# Patient Record
Sex: Female | Born: 2000
Health system: Southern US, Community
[De-identification: ages and names within clinical notes are randomized; demographics above are authoritative.]

## PROBLEM LIST (undated history)

## (undated) ENCOUNTER — Inpatient Hospital Stay (HOSPITAL_COMMUNITY): Payer: Self-pay

## (undated) DIAGNOSIS — F909 Attention-deficit hyperactivity disorder, unspecified type: Secondary | ICD-10-CM

## (undated) DIAGNOSIS — F319 Bipolar disorder, unspecified: Secondary | ICD-10-CM

## (undated) DIAGNOSIS — A549 Gonococcal infection, unspecified: Secondary | ICD-10-CM

## (undated) DIAGNOSIS — A749 Chlamydial infection, unspecified: Secondary | ICD-10-CM

## (undated) DIAGNOSIS — G4700 Insomnia, unspecified: Secondary | ICD-10-CM

## (undated) HISTORY — PX: CHOLECYSTECTOMY: SHX55

---
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
## 2019-01-09 ENCOUNTER — Other Ambulatory Visit: Payer: Self-pay

## 2019-01-09 DIAGNOSIS — Z87891 Personal history of nicotine dependence: Secondary | ICD-10-CM | POA: Insufficient documentation

## 2019-01-09 DIAGNOSIS — R1084 Generalized abdominal pain: Secondary | ICD-10-CM | POA: Insufficient documentation

## 2019-01-09 DIAGNOSIS — Z30430 Encounter for insertion of intrauterine contraceptive device: Secondary | ICD-10-CM | POA: Diagnosis not present

## 2019-01-10 ENCOUNTER — Other Ambulatory Visit: Payer: Self-pay

## 2019-01-10 ENCOUNTER — Emergency Department (HOSPITAL_COMMUNITY)
Admission: EM | Admit: 2019-01-10 | Discharge: 2019-01-10 | Disposition: A | Discharge status: Home / Self Care | Payer: BC Managed Care – PPO | Attending: Emergency Medicine | Admitting: Emergency Medicine

## 2019-01-10 ENCOUNTER — Emergency Department (HOSPITAL_COMMUNITY): Payer: BC Managed Care – PPO

## 2019-01-10 ENCOUNTER — Encounter (HOSPITAL_COMMUNITY): Payer: Self-pay | Admitting: *Deleted

## 2019-01-10 DIAGNOSIS — R1084 Generalized abdominal pain: Secondary | ICD-10-CM | POA: Diagnosis not present

## 2019-01-10 DIAGNOSIS — Z30430 Encounter for insertion of intrauterine contraceptive device: Secondary | ICD-10-CM | POA: Diagnosis not present

## 2019-01-10 LAB — URINALYSIS, ROUTINE W REFLEX MICROSCOPIC
Bilirubin Urine: NEGATIVE
Glucose, UA: NEGATIVE mg/dL
Hgb urine dipstick: NEGATIVE
Ketones, ur: NEGATIVE mg/dL
Nitrite: NEGATIVE
Protein, ur: NEGATIVE mg/dL
Specific Gravity, Urine: 1.021 (ref 1.005–1.030)
pH: 6 (ref 5.0–8.0)

## 2019-01-10 LAB — PREGNANCY, URINE: Preg Test, Ur: NEGATIVE

## 2019-01-10 MED ORDER — HYDROCODONE-ACETAMINOPHEN 5-325 MG PO TABS
1.0000 | ORAL_TABLET | Freq: Once | ORAL | Status: AC
  Administered 2019-01-10: 1 via ORAL
  Filled 2019-01-10: qty 1

## 2019-01-10 MED ORDER — CEPHALEXIN 500 MG PO CAPS: 500.0000 mg | ORAL_CAPSULE | Freq: Two times a day (BID) | ORAL | 0 refills | Status: DC

## 2019-01-10 MED ORDER — HYDROCODONE-ACETAMINOPHEN 5-325 MG PO TABS: 1.0000 | ORAL_TABLET | Freq: Four times a day (QID) | ORAL | 0 refills | Status: DC | PRN

## 2019-01-10 MED ORDER — CEPHALEXIN 500 MG PO CAPS
500.0000 mg | ORAL_CAPSULE | Freq: Once | ORAL | Status: AC
  Administered 2019-01-10: 02:00:00 500 mg via ORAL
  Filled 2019-01-10: qty 1

## 2019-01-10 NOTE — ED Provider Notes (Signed)
[facility] EMERGENCY DEPARTMENT Provider Note   CSN: [id number] Arrival date & time: 01/09/19  2333     History   Chief Complaint Chief Complaint  Patient presents with  . Abdominal Pain    HPI Diana Pennington is a 18 y.o. female.     The history is provided by the patient.  Abdominal Pain Pain location:  Generalized Pain quality: aching   Pain radiates to:  Back Pain severity:  Moderate Onset quality:  Gradual Timing:  Intermittent Progression:  Worsening Chronicity:  New Relieved by:  Nothing Worsened by:  Nothing Associated symptoms: no dysuria, no fever, no vaginal bleeding and no vaginal discharge   Patient with history of ADHD presents with abdominal pain.  She reports over the past several days she has had increasing abdominal pain that she feels is related to her IUD.  No fevers or vomiting.  No diarrhea.  No dysuria, no vaginal discharge.  She reports at times the pain will radiate to her back  Past Medical History:  Diagnosis Date  . ADHD   . ADHD (attention deficit hyperactivity disorder)   . Attention deficit hyperactivity disorder (ADHD) 07/21/2015  . Bipolar 1 disorder ([place])   . Bipolar 1 disorder ([place])   . Chlamydia   . Gonorrhea   . Insomnia 07/21/2015    Patient Active Problem List   Diagnosis Date Noted  . PID (acute pelvic inflammatory disease) 04/11/2017  . Indication for care in labor or delivery 04/23/2016  . Bipolar affect, depressed ([place]) 07/21/2015  . Attention deficit hyperactivity disorder (ADHD) 07/21/2015  . Insomnia 07/21/2015    Past Surgical History:  Procedure Laterality Date  . CHOLECYSTECTOMY       OB History    Gravida  1   Para  1   Term  1   Preterm      AB      Living  1     SAB      TAB      Ectopic      Multiple  0   Live Births  1            Home Medications    Prior to Admission medications   Medication Sig Start Date End Date Taking? Authorizing Provider  ARIPiprazole (ABILIFY) 10  MG tablet Take 10 mg by mouth 2 (two) times daily.  10/12/16   [provider]  atomoxetine (STRATTERA) 40 MG capsule  07/11/17   [provider]  benztropine (COGENTIN) 0.5 MG tablet Take 1 tablet (0.5 mg total) by mouth 2 (two) times daily. 07/29/15   Philipp Ovens, MD  escitalopram (LEXAPRO) 10 MG tablet Take 10 mg by mouth at bedtime.  10/27/16   [provider]  hydrOXYzine (ATARAX/VISTARIL) 10 MG tablet Take 10 mg by mouth 2 (two) times daily as needed for itching or anxiety.    [provider]  levonorgestrel (MIRENA) 20 MCG/24HR IUD 1 each by Intrauterine route once.    [provider]    Family History Family History  Problem Relation Age of Onset  . Diabetes Mother   . Hypertension Mother     Social History Social History   Tobacco Use  . Smoking status: Former Smoker    Packs/day: 0.50    Years: 1.00    Pack years: 0.50    Types: Cigarettes  . Smokeless tobacco: Never Used  Substance Use Topics  . Alcohol use: No  . Drug use: Yes  Types: Marijuana     Allergies   Ativan [lorazepam]   Review of Systems Review of Systems  Constitutional: Negative for fever.  Gastrointestinal: Positive for abdominal pain.  Genitourinary: Negative for dysuria, vaginal bleeding and vaginal discharge.  All other systems reviewed and are negative.    Physical Exam Updated Vital Signs BP 119/87 (BP Location: Right Arm)   Pulse 81   Temp 98.2 F (36.8 C) (Oral)   Resp 18   Ht 1.549 m (5\' 1" )   Wt 72.5 kg   SpO2 100%   BMI 30.20 kg/m   Physical Exam CONSTITUTIONAL: Well developed/well nourished, no acute distress HEAD: Normocephalic/atraumatic EYES: EOMI ENMT: Mask in place NECK: supple no meningeal signs SPINE/BACK:entire spine nontender CV: S1/S2 noted, no murmurs/rubs/gallops noted LUNGS: Lungs are clear to auscultation bilaterally, no apparent distress ABDOMEN: soft, mild diffuse tenderness, no rebound or  guarding, bowel sounds noted throughout abdomen GU:no cva tenderness NEURO: Pt is awake/alert/appropriate, moves all extremitiesx4.  No facial droop.   EXTREMITIES: pulses normal/equal, full ROM SKIN: warm, color normal PSYCH: no abnormalities of mood noted, alert and oriented to situation   ED Treatments / Results  Labs (all labs ordered are listed, but only abnormal results are displayed) Labs Reviewed  URINALYSIS, ROUTINE W REFLEX MICROSCOPIC - Abnormal; Notable for the following components:      Result Value   APPearance HAZY (*)    Leukocytes,Ua SMALL (*)    Bacteria, UA RARE (*)    All other components within normal limits  PREGNANCY, URINE    EKG None  Radiology Dg Abdomen 1 View  Result Date: 01/10/2019 CLINICAL DATA:  IUD placement EXAM: ABDOMEN - 1 VIEW COMPARISON:  CT 08/08/2017 FINDINGS: Nonobstructed bowel-gas pattern. IUD within the pelvis. Lung bases are clear IMPRESSION: 1. Nonobstructed gas pattern 2. IUD within the pelvis. If more detailed evaluation of the IUD position is required, pelvic ultrasound or CT could be obtained. Electronically Signed   By: 08/10/2017 M.D.   On: 01/10/2019 02:35    Procedures Procedures Medications Ordered in ED Medications  cephALEXin (KEFLEX) capsule 500 mg (500 mg Oral Given 01/10/19 0221)  HYDROcodone-acetaminophen (NORCO/VICODIN) 5-325 MG per tablet 1 tablet (1 tablet Oral Given 01/10/19 0221)     Initial Impression / Assessment and Plan / ED Course  I have reviewed the triage vital signs and the nursing notes.  Pertinent labs & imaging results that were available during my care of the patient were reviewed by me and considered in my medical decision making (see chart for details).        2:05 AM Patient is very concerned about her IUD.  Will obtain a KUB to ensure placement.  She may also be developing a urinary tract infection, will give antibiotics and pain medicine 3:11 AM Will treat for UTI Patient is  well-appearing no acute distress.  She is watching television and in no distress.  She does have mild diffuse tenderness, but no signs of acute abdomen Patient is very concerned about her IUD placement.  However she denies any vaginal bleeding or discharge, reports she has not been sexually active in 6 months She is not interested in STD testing I did offer her next day pelvic ultrasound to evaluate pain and IUD placement.  Patient is agreeable with plan.  Also advised follow-up with her OB/GYN.  We discussed strict ER return precautions.  She also request short course of pain medicine.  Final Clinical Impressions(s) / ED Diagnoses   Final  diagnoses:  Generalized abdominal pain    ED Discharge Orders         Ordered    cephALEXin (KEFLEX) 500 MG capsule  2 times daily     01/10/19 0254    US Transvaginal Non-OB (US Pelvis)     01/10/19 0254    HYDROcodone-acetaminophen (NORCO/VICODIN) 5-325 MG tablet  Every 6 hours PRN     01/10/19 0310           Zadie RhineWickline, Abdinasir Spadafore, MD 01/10/19 [phone]

## 2019-01-10 NOTE — Discharge Instructions (Addendum)

## 2019-01-10 NOTE — ED Triage Notes (Signed)
Pt c/o pelvic pain; pt states she feels like her IUD is not in place; pt c/o bilateral lower back pain that started today

## 2019-03-09 ENCOUNTER — Emergency Department (HOSPITAL_COMMUNITY)
Admission: EM | Admit: 2019-03-09 | Discharge: 2019-03-10 | Disposition: A | Discharge status: Home / Self Care | Payer: BC Managed Care – PPO | Attending: Emergency Medicine | Admitting: Emergency Medicine

## 2019-03-09 ENCOUNTER — Encounter (HOSPITAL_COMMUNITY): Payer: Self-pay | Admitting: Emergency Medicine

## 2019-03-09 ENCOUNTER — Other Ambulatory Visit: Payer: Self-pay

## 2019-03-09 DIAGNOSIS — H5712 Ocular pain, left eye: Secondary | ICD-10-CM | POA: Diagnosis not present

## 2019-03-09 DIAGNOSIS — Z5321 Procedure and treatment not carried out due to patient leaving prior to being seen by health care provider: Secondary | ICD-10-CM | POA: Diagnosis not present

## 2019-03-09 NOTE — ED Triage Notes (Signed)
Patient states that she has been having sharp pains in her left eye x 1 week. Patient also states that she has head congestion x 1 week.

## 2019-03-10 NOTE — ED Notes (Signed)
No answer in waiting room X

## 2019-03-10 NOTE — ED Notes (Signed)
No answer in waiting room X1,  

## 2019-03-18 ENCOUNTER — Other Ambulatory Visit: Payer: Self-pay

## 2019-03-18 ENCOUNTER — Emergency Department (HOSPITAL_COMMUNITY)
Admission: EM | Admit: 2019-03-18 | Discharge: 2019-03-18 | Disposition: A | Discharge status: Home / Self Care | Payer: BC Managed Care – PPO | Attending: Emergency Medicine | Admitting: Emergency Medicine

## 2019-03-18 DIAGNOSIS — Z87891 Personal history of nicotine dependence: Secondary | ICD-10-CM | POA: Insufficient documentation

## 2019-03-18 DIAGNOSIS — R8281 Pyuria: Secondary | ICD-10-CM | POA: Diagnosis not present

## 2019-03-18 DIAGNOSIS — Z79899 Other long term (current) drug therapy: Secondary | ICD-10-CM | POA: Insufficient documentation

## 2019-03-18 DIAGNOSIS — R1030 Lower abdominal pain, unspecified: Secondary | ICD-10-CM | POA: Diagnosis not present

## 2019-03-18 LAB — URINALYSIS, ROUTINE W REFLEX MICROSCOPIC
Bacteria, UA: NONE SEEN
Bilirubin Urine: NEGATIVE
Glucose, UA: NEGATIVE mg/dL
Hgb urine dipstick: NEGATIVE
Ketones, ur: NEGATIVE mg/dL
Nitrite: NEGATIVE
Protein, ur: NEGATIVE mg/dL
Specific Gravity, Urine: 1.011 (ref 1.005–1.030)
pH: 6 (ref 5.0–8.0)

## 2019-03-18 LAB — PREGNANCY, URINE: Preg Test, Ur: NEGATIVE

## 2019-03-18 MED ORDER — NITROFURANTOIN MONOHYD MACRO 100 MG PO CAPS: 100.0000 mg | ORAL_CAPSULE | Freq: Two times a day (BID) | ORAL | 0 refills | Status: DC

## 2019-03-18 NOTE — Discharge Instructions (Signed)
Please take Macrobid twice a day for 5 days, seek medical exam for severe or worsening symptoms.  If your testing comes back positive for an STD will be contacted

## 2019-03-18 NOTE — ED Provider Notes (Signed)
[facility] EMERGENCY DEPARTMENT Provider Note   CSN: [id number] Arrival date & time: 03/18/19  8833     History Chief Complaint  Patient presents with  . Abdominal Pain    Diana Pennington is a 18 y.o. female.  HPI   This patient is a 18 year old female, she presents to the hospital with a complaint of some lower to mid abdominal discomfort with urinary frequency which has been going on for a couple of days.  She denies any vaginal discharge and has had no new sexual partners.  She has an IUD in place and doubts that she is pregnant.  She has no back pain, no fevers or chills, no nausea or vomiting.  She denies any dysuria.  Her last urinary tract infection was last year.  She has had no medications prior to arrival, the symptoms are intermittent.  She denies any prior abdominal surgery.  Her last CT scan was in May 2019, after trauma, it was unremarkable  Past Medical History:  Diagnosis Date  . ADHD   . ADHD (attention deficit hyperactivity disorder)   . Attention deficit hyperactivity disorder (ADHD) 07/21/2015  . Bipolar 1 disorder (HCC)   . Bipolar 1 disorder (HCC)   . Chlamydia   . Gonorrhea   . Insomnia 07/21/2015    Patient Active Problem List   Diagnosis Date Noted  . PID (acute pelvic inflammatory disease) 04/11/2017  . Indication for care in labor or delivery 04/23/2016  . Bipolar affect, depressed (HCC) 07/21/2015  . Attention deficit hyperactivity disorder (ADHD) 07/21/2015  . Insomnia 07/21/2015    Past Surgical History:  Procedure Laterality Date  . CHOLECYSTECTOMY       OB History    Gravida  1   Para  1   Term  1   Preterm      AB      Living  1     SAB      TAB      Ectopic      Multiple  0   Live Births  1           Family History  Problem Relation Age of Onset  . Diabetes Mother   . Hypertension Mother     Social History   Tobacco Use  . Smoking status: Former Smoker    Packs/day: 0.50    Years: 1.00    Pack years:  0.50    Types: Cigarettes  . Smokeless tobacco: Never Used  Substance Use Topics  . Alcohol use: No  . Drug use: Yes    Types: Marijuana    Home Medications Prior to Admission medications   Medication Sig Start Date End Date Taking? Authorizing Provider  ARIPiprazole (ABILIFY) 10 MG tablet Take 10 mg by mouth 2 (two) times daily.  10/12/16   [provider]  atomoxetine (STRATTERA) 40 MG capsule  07/11/17   [provider]  benztropine (COGENTIN) 0.5 MG tablet Take 1 tablet (0.5 mg total) by mouth 2 (two) times daily. 07/29/15   [name], MD  escitalopram (LEXAPRO) 10 MG tablet Take 10 mg by mouth at bedtime.  10/27/16   [provider]  hydrOXYzine (ATARAX/VISTARIL) 10 MG tablet Take 10 mg by mouth 2 (two) times daily as needed for itching or anxiety.    [provider]  levonorgestrel (MIRENA) 20 MCG/24HR IUD 1 each by Intrauterine route once.    [provider]  nitrofurantoin, macrocrystal-monohydrate, (MACROBID) 100 MG capsule Take 1 capsule (  100 mg total) by mouth 2 (two) times daily. 03/18/19   Eber HongMiller, Kylin Genna, MD    Allergies    Ativan [lorazepam]  Review of Systems   Review of Systems  Constitutional: Negative for chills and fever.  Gastrointestinal: Positive for abdominal pain. Negative for nausea and vomiting.  Genitourinary: Positive for difficulty urinating and frequency. Negative for dysuria, flank pain, menstrual problem, vaginal bleeding, vaginal discharge and vaginal pain.  Musculoskeletal: Negative for back pain.    Physical Exam Updated Vital Signs BP 108/66 (BP Location: Right Arm)   Pulse 87   Temp 97.6 F (36.4 C) (Oral)   Resp 18   Ht 1.549 m (5\' 1" )   Wt 68 kg   LMP 02/23/2019 (Exact Date)   SpO2 100%   BMI 28.33 kg/m   Physical Exam Vitals and nursing note reviewed.  Constitutional:      General: She is not in acute distress.    Appearance: She is well-developed.  HENT:     Head:  Normocephalic and atraumatic.     Mouth/Throat:     Pharynx: No oropharyngeal exudate.  Eyes:     General: No scleral icterus.       Right eye: No discharge.        Left eye: No discharge.     Conjunctiva/sclera: Conjunctivae normal.     Pupils: Pupils are equal, round, and reactive to light.  Neck:     Thyroid: No thyromegaly.     Vascular: No JVD.  Cardiovascular:     Rate and Rhythm: Normal rate and regular rhythm.     Heart sounds: Normal heart sounds. No murmur. No friction rub. No gallop.   Pulmonary:     Effort: Pulmonary effort is normal. No respiratory distress.     Breath sounds: Normal breath sounds. No wheezing or rales.  Abdominal:     General: Bowel sounds are normal. There is no distension.     Palpations: Abdomen is soft. There is no mass.     Tenderness: There is abdominal tenderness.     Comments: Mild lower abdominal tenderness but very soft and no guarding masses or peritoneal signs  Musculoskeletal:        General: No tenderness. Normal range of motion.     Cervical back: Normal range of motion and neck supple.  Lymphadenopathy:     Cervical: No cervical adenopathy.  Skin:    General: Skin is warm and dry.     Findings: No erythema or rash.  Neurological:     Mental Status: She is alert.     Coordination: Coordination normal.  Psychiatric:        Behavior: Behavior normal.     ED Results / Procedures / Treatments   Labs (all labs ordered are listed, but only abnormal results are displayed) Labs Reviewed  URINALYSIS, ROUTINE W REFLEX MICROSCOPIC - Abnormal; Notable for the following components:      Result Value   Color, Urine STRAW (*)    Leukocytes,Ua MODERATE (*)    All other components within normal limits  URINE CULTURE  PREGNANCY, URINE  GC/CHLAMYDIA PROBE AMP ([place] NOT [facility]    EKG None  Radiology No results found.  Procedures Procedures (including critical care time)  Medications Ordered in ED Medications - No data  to display  ED Course  I have reviewed the triage vital signs and the nursing notes.  Pertinent labs & imaging results that were available during my care of the patient  were reviewed by me and considered in my medical decision making (see chart for details).  Clinical Course as of Mar 17 426  Mon Mar 18, 2019  3570 Urinalysis reveals moderate leukocytes with 21-50 white blood cells but no bacteria.   [BM]  [phone] We will add on urine for gonorrhea and chlamydia and culture is pending   [BM]    Clinical Course User Index [BM] Noemi Chapel, MD   MDM Rules/Calculators/A&P                      The patient is well-appearing, her vital signs are unremarkable, her abdominal exam is benign, there is no signs of acute appendicitis.  She has had some urinary frequency for which she will have a urinary evaluation with a culture as well and a pregnancy test.  Patient is agreeable, denies discharge, denies fevers, denies nausea or vomiting.  There is no pain McBurney's point and no tenderness for upper abdominal tenderness to suggest cholecystitis or pancreatitis.  Final Clinical Impression(s) / ED Diagnoses Final diagnoses:  Pyuria    Rx / DC Orders ED Discharge Orders         Ordered    nitrofurantoin, macrocrystal-monohydrate, (MACROBID) 100 MG capsule  2 times daily     03/18/19 0426           Noemi Chapel, MD 03/18/19 [phone]

## 2019-03-18 NOTE — ED Triage Notes (Signed)
Pt here because she thinks she has an UTI. Per pt, having lower abdominal pain and urinary frequency.

## 2019-03-19 LAB — URINE CULTURE: Culture: NO GROWTH

## 2019-04-05 DIAGNOSIS — Z01419 Encounter for gynecological examination (general) (routine) without abnormal findings: Secondary | ICD-10-CM | POA: Diagnosis not present

## 2019-04-05 DIAGNOSIS — R87610 Atypical squamous cells of undetermined significance on cytologic smear of cervix (ASC-US): Secondary | ICD-10-CM | POA: Diagnosis not present

## 2019-04-05 DIAGNOSIS — Z113 Encounter for screening for infections with a predominantly sexual mode of transmission: Secondary | ICD-10-CM | POA: Diagnosis not present

## 2019-04-05 DIAGNOSIS — B009 Herpesviral infection, unspecified: Secondary | ICD-10-CM | POA: Diagnosis not present

## 2019-04-05 DIAGNOSIS — Z6829 Body mass index (BMI) 29.0-29.9, adult: Secondary | ICD-10-CM | POA: Diagnosis not present

## 2019-04-20 ENCOUNTER — Other Ambulatory Visit: Payer: Self-pay

## 2019-04-20 ENCOUNTER — Emergency Department (HOSPITAL_COMMUNITY)
Admission: EM | Admit: 2019-04-20 | Discharge: 2019-04-20 | Disposition: A | Discharge status: Home / Self Care | Payer: BC Managed Care – PPO | Attending: Emergency Medicine | Admitting: Emergency Medicine

## 2019-04-20 ENCOUNTER — Emergency Department (HOSPITAL_COMMUNITY): Payer: BC Managed Care – PPO

## 2019-04-20 ENCOUNTER — Encounter (HOSPITAL_COMMUNITY): Payer: Self-pay

## 2019-04-20 DIAGNOSIS — F909 Attention-deficit hyperactivity disorder, unspecified type: Secondary | ICD-10-CM | POA: Diagnosis not present

## 2019-04-20 DIAGNOSIS — F1210 Cannabis abuse, uncomplicated: Secondary | ICD-10-CM | POA: Diagnosis not present

## 2019-04-20 DIAGNOSIS — R079 Chest pain, unspecified: Secondary | ICD-10-CM | POA: Diagnosis not present

## 2019-04-20 DIAGNOSIS — Z79899 Other long term (current) drug therapy: Secondary | ICD-10-CM | POA: Insufficient documentation

## 2019-04-20 DIAGNOSIS — R0602 Shortness of breath: Secondary | ICD-10-CM | POA: Diagnosis not present

## 2019-04-20 DIAGNOSIS — Z87891 Personal history of nicotine dependence: Secondary | ICD-10-CM | POA: Diagnosis not present

## 2019-04-20 DIAGNOSIS — R072 Precordial pain: Secondary | ICD-10-CM | POA: Diagnosis not present

## 2019-04-20 LAB — CBC WITH DIFFERENTIAL/PLATELET
Abs Immature Granulocytes: 0.02 10*3/uL (ref 0.00–0.07)
Basophils Absolute: 0.1 10*3/uL (ref 0.0–0.1)
Basophils Relative: 1 %
Eosinophils Absolute: 0.3 10*3/uL (ref 0.0–0.5)
Eosinophils Relative: 4 %
HCT: 42.7 % (ref 36.0–46.0)
Hemoglobin: 14.1 g/dL (ref 12.0–15.0)
Immature Granulocytes: 0 %
Lymphocytes Relative: 26 %
Lymphs Abs: 2 10*3/uL (ref 0.7–4.0)
MCH: 32.2 pg (ref 26.0–34.0)
MCHC: 33 g/dL (ref 30.0–36.0)
MCV: 97.5 fL (ref 80.0–100.0)
Monocytes Absolute: 0.4 10*3/uL (ref 0.1–1.0)
Monocytes Relative: 5 %
Neutro Abs: 4.9 10*3/uL (ref 1.7–7.7)
Neutrophils Relative %: 64 %
Platelets: 266 10*3/uL (ref 150–400)
RBC: 4.38 MIL/uL (ref 3.87–5.11)
RDW: 12.1 % (ref 11.5–15.5)
WBC: 7.8 10*3/uL (ref 4.0–10.5)
nRBC: 0 % (ref 0.0–0.2)

## 2019-04-20 LAB — URINALYSIS, ROUTINE W REFLEX MICROSCOPIC
Bacteria, UA: NONE SEEN
Bilirubin Urine: NEGATIVE
Glucose, UA: NEGATIVE mg/dL
Ketones, ur: NEGATIVE mg/dL
Nitrite: NEGATIVE
Protein, ur: NEGATIVE mg/dL
Specific Gravity, Urine: 1.01 (ref 1.005–1.030)
pH: 6 (ref 5.0–8.0)

## 2019-04-20 LAB — PREGNANCY, URINE: Preg Test, Ur: NEGATIVE

## 2019-04-20 LAB — BASIC METABOLIC PANEL
Anion gap: 11 (ref 5–15)
BUN: 8 mg/dL (ref 6–20)
CO2: 23 mmol/L (ref 22–32)
Calcium: 9.3 mg/dL (ref 8.9–10.3)
Chloride: 107 mmol/L (ref 98–111)
Creatinine, Ser: 0.79 mg/dL (ref 0.44–1.00)
GFR calc Af Amer: >60 mL/min (ref >60)
GFR calc non Af Amer: >60 mL/min (ref >60)
Glucose, Bld: 79 mg/dL (ref 70–99)
Potassium: 3.7 mmol/L (ref 3.5–5.1)
Sodium: 141 mmol/L (ref 135–145)

## 2019-04-20 LAB — TROPONIN I (HIGH SENSITIVITY): Troponin I (High Sensitivity): <2 ng/L (ref <18)

## 2019-04-20 MED ORDER — IBUPROFEN 600 MG PO TABS: 600.0000 mg | ORAL_TABLET | Freq: Four times a day (QID) | ORAL | 0 refills | Status: DC | PRN

## 2019-04-20 NOTE — Discharge Instructions (Addendum)
Follow-up with your doctor for recheck.  Return here for any worsening symptoms

## 2019-04-20 NOTE — ED Provider Notes (Signed)
[facility] EMERGENCY DEPARTMENT Provider Note   CSN: [id number] Arrival date & time: 04/20/19  2051     History Chief Complaint  Patient presents with  . Chest Pain    Diana Pennington is a 19 y.o. female.  HPI     Diana Pennington is a 19 y.o. female who presents to the Emergency Department complaining of substernal chest pain that began at 9 AM this morning.  Pain began while at rest.  She describes the pain as sharp and radiating toward the bottom of her ribs.  She states the pain has been waxing and waning in severity since onset.  She also reports that pain has been associated with shortness of breath.  She denies any alleviating or precipitating factors.  She denies known injury, nausea, vomiting, arm pain, abdominal pain fever or chills.  She admits to smoking marijuana at times, but denies any recent use.  She also denies any oral or IV drug use. No known COVID exposures   Past Medical History:  Diagnosis Date  . ADHD   . ADHD (attention deficit hyperactivity disorder)   . Attention deficit hyperactivity disorder (ADHD) 07/21/2015  . Bipolar 1 disorder ([place])   . Bipolar 1 disorder ([place])   . Chlamydia   . Gonorrhea   . Insomnia 07/21/2015    Patient Active Problem List   Diagnosis Date Noted  . PID (acute pelvic inflammatory disease) 04/11/2017  . Indication for care in labor or delivery 04/23/2016  . Bipolar affect, depressed ([place]) 07/21/2015  . Attention deficit hyperactivity disorder (ADHD) 07/21/2015  . Insomnia 07/21/2015    Past Surgical History:  Procedure Laterality Date  . CHOLECYSTECTOMY       OB History    Gravida  1   Para  1   Term  1   Preterm      AB      Living  1     SAB      TAB      Ectopic      Multiple  0   Live Births  1           Family History  Problem Relation Age of Onset  . Diabetes Mother   . Hypertension Mother     Social History   Tobacco Use  . Smoking status: Former Smoker    Packs/day: 0.50   Years: 1.00    Pack years: 0.50    Types: Cigarettes  . Smokeless tobacco: Never Used  Substance Use Topics  . Alcohol use: No  . Drug use: Yes    Types: Marijuana    Home Medications Prior to Admission medications   Medication Sig Start Date End Date Taking? Authorizing Provider  ARIPiprazole (ABILIFY) 10 MG tablet Take 10 mg by mouth 2 (two) times daily.  10/12/16   [provider]  atomoxetine (STRATTERA) 40 MG capsule  07/11/17   [provider]  benztropine (COGENTIN) 0.5 MG tablet Take 1 tablet (0.5 mg total) by mouth 2 (two) times daily. 07/29/15   Philipp Ovens, MD  escitalopram (LEXAPRO) 10 MG tablet Take 10 mg by mouth at bedtime.  10/27/16   [provider]  hydrOXYzine (ATARAX/VISTARIL) 10 MG tablet Take 10 mg by mouth 2 (two) times daily as needed for itching or anxiety.    [provider]  levonorgestrel (MIRENA) 20 MCG/24HR IUD 1 each by Intrauterine route once.    [provider]  nitrofurantoin, macrocrystal-monohydrate, (MACROBID) 100 MG capsule Take 1  capsule (100 mg total) by mouth 2 (two) times daily. 03/18/19   Eber Hong, MD    Allergies    Ativan [lorazepam]  Review of Systems   Review of Systems  Constitutional: Negative for chills, fatigue and fever.  Respiratory: Negative for cough.   Cardiovascular: Positive for chest pain. Negative for palpitations.  Gastrointestinal: Negative for abdominal pain, diarrhea, nausea and vomiting.  Genitourinary: Negative for dysuria and flank pain.  Musculoskeletal: Negative for back pain.  Skin: Negative for rash.  Neurological: Negative for dizziness, syncope, numbness and headaches.    Physical Exam Updated Vital Signs BP 109/70 (BP Location: Right Arm)   Pulse (!) 58   Temp 97.7 F (36.5 C) (Oral)   Resp 17   Ht 5\' 1"  (1.549 m)   Wt 74.4 kg   SpO2 100%   BMI 30.99 kg/m   Physical Exam Constitutional:      General: She is not in acute distress.     Appearance: Normal appearance. She is well-developed. She is not ill-appearing.  HENT:     Head: Atraumatic.  Eyes:     Conjunctiva/sclera: Conjunctivae normal.     Pupils: Pupils are equal, round, and reactive to light.  Cardiovascular:     Rate and Rhythm: Normal rate and regular rhythm.     Heart sounds: No murmur.  Pulmonary:     Effort: Pulmonary effort is normal. No respiratory distress.     Breath sounds: Normal breath sounds.  Chest:     Chest wall: No tenderness.  Abdominal:     General: There is no distension.     Palpations: Abdomen is soft.     Tenderness: There is no abdominal tenderness.  Musculoskeletal:        General: Normal range of motion.     Cervical back: Normal range of motion.     Right lower leg: No edema.     Left lower leg: No edema.  Skin:    Findings: No rash.  Neurological:     General: No focal deficit present.     Mental Status: She is alert.     Sensory: No sensory deficit.     Motor: No weakness.     ED Results / Procedures / Treatments   Labs (all labs ordered are listed, but only abnormal results are displayed) Labs Reviewed  URINALYSIS, ROUTINE W REFLEX MICROSCOPIC - Abnormal; Notable for the following components:      Result Value   Hgb urine dipstick SMALL (*)    Leukocytes,Ua MODERATE (*)    Crystals PRESENT (*)    All other components within normal limits  PREGNANCY, URINE  CBC WITH DIFFERENTIAL/PLATELET  BASIC METABOLIC PANEL  TROPONIN I (HIGH SENSITIVITY)    EKG EKG Interpretation  Date/Time:  Saturday April 20 2019 21:05:54 EST Ventricular Rate:  60 PR Interval:    QRS Duration: 90 QT Interval:  419 QTC Calculation: 419 R Axis:   92 Text Interpretation: Sinus rhythm Borderline short PR interval No STEMI Confirmed by [birth year]1989 [phone]) on 04/20/2019 9:08:51 PM   Radiology DG Chest Portable 1 View  Result Date: 04/20/2019 CLINICAL DATA:  Chest pain. EXAM: PORTABLE CHEST 1 VIEW COMPARISON:  September 21, 2017  FINDINGS: The heart size and mediastinal contours are within normal limits. Both lungs are clear. The visualized skeletal structures are unremarkable. IMPRESSION: No active disease. Electronically Signed   By: September 23, 2017 M.D.   On: 04/20/2019 22:06    Procedures Procedures (including critical care  time)  Medications Ordered in ED Medications - No data to display  ED Course  I have reviewed the triage vital signs and the nursing notes.  Pertinent labs & imaging results that were available during my care of the patient were reviewed by me and considered in my medical decision making (see chart for details).    MDM Rules/Calculators/A&P                       Patient with history of substernal chest pain since 9 AM this morning.  Pain has been intermittent.  Dyspnea only when pain is present.  Work-up this evening is reassuring. Denies pain at present.  Urinalysis shows leukocytes and crystals, patient had urinalysis in December with leukocytes as well and urine culture was negative for growth.  Patient does not currently have dysuria symptoms.  Pain is felt to be likely musculoskeletal, no concerning symptoms for ACS.  She is PERC negative.  I feel that she is appropriate for discharge home, she agrees to close outpatient follow-up and strict return precautions were also discussed.    Final Clinical Impression(s) / ED Diagnoses Final diagnoses:  Precordial chest pain    Rx / DC Orders ED Discharge Orders    None       Pauline Aus, PA-C 04/20/19 2243    Terald Sleeper, MD 04/21/19 1110

## 2019-04-20 NOTE — ED Triage Notes (Signed)
Pt presents to ED with complaints of mid chest pain which radiated throughout abdomen. Pt also c/o SOB. Pt states symptoms started at 0900.

## 2019-05-12 DIAGNOSIS — R112 Nausea with vomiting, unspecified: Secondary | ICD-10-CM | POA: Diagnosis not present

## 2019-05-12 DIAGNOSIS — R6883 Chills (without fever): Secondary | ICD-10-CM | POA: Diagnosis not present

## 2019-05-12 DIAGNOSIS — R109 Unspecified abdominal pain: Secondary | ICD-10-CM | POA: Diagnosis not present

## 2019-06-05 DIAGNOSIS — Z113 Encounter for screening for infections with a predominantly sexual mode of transmission: Secondary | ICD-10-CM | POA: Diagnosis not present

## 2019-06-05 DIAGNOSIS — R350 Frequency of micturition: Secondary | ICD-10-CM | POA: Diagnosis not present

## 2019-06-05 DIAGNOSIS — A5609 Other chlamydial infection of lower genitourinary tract: Secondary | ICD-10-CM | POA: Diagnosis not present

## 2019-06-24 DIAGNOSIS — Z30433 Encounter for removal and reinsertion of intrauterine contraceptive device: Secondary | ICD-10-CM | POA: Diagnosis not present

## 2019-08-15 DIAGNOSIS — Z68.41 Body mass index (BMI) pediatric, less than 5th percentile for age: Secondary | ICD-10-CM | POA: Diagnosis not present

## 2019-08-15 DIAGNOSIS — Z1389 Encounter for screening for other disorder: Secondary | ICD-10-CM | POA: Diagnosis not present

## 2019-08-15 DIAGNOSIS — F419 Anxiety disorder, unspecified: Secondary | ICD-10-CM | POA: Diagnosis not present

## 2019-08-15 DIAGNOSIS — Z0001 Encounter for general adult medical examination with abnormal findings: Secondary | ICD-10-CM | POA: Diagnosis not present

## 2019-08-30 ENCOUNTER — Other Ambulatory Visit: Payer: Self-pay

## 2019-08-30 ENCOUNTER — Encounter: Payer: Self-pay | Admitting: Emergency Medicine

## 2019-08-30 ENCOUNTER — Ambulatory Visit
Admission: EM | Admit: 2019-08-30 | Discharge: 2019-08-30 | Disposition: A | Discharge status: Home / Self Care | Payer: BC Managed Care – PPO | Attending: Emergency Medicine | Admitting: Emergency Medicine

## 2019-08-30 DIAGNOSIS — Z113 Encounter for screening for infections with a predominantly sexual mode of transmission: Secondary | ICD-10-CM | POA: Insufficient documentation

## 2019-08-30 NOTE — ED Provider Notes (Signed)
[facility]   [id number] 08/30/19 Arrival Time: 1320   [id number] DISCHARGE  SUBJECTIVE:  Leita Lindbloom is a 19 y.o. female who presents with vaginal discharge for the past 2 days.  She denies a precipitating event, recent sexual encounter or recent antibiotic use.  Patient is sexually active with 1 female partner.  Describes discharge as thin light yellow.  She has tried OTC medications with/ without relief.  Denies any worsening symptoms.  She reports similar symptoms in the past and was diagnosed with gonorrhea and treated accordingly.  She denies fever, chills, nausea, vomiting, abdominal or pelvic pain, urinary symptoms, vaginal itching, vaginal odor, vaginal bleeding, dyspareunia, vaginal rashes or lesions.   No LMP recorded. (Menstrual status: IUD). Current birth control method: Compliant with BC:  ROS: As per HPI.  All other pertinent ROS negative.     Past Medical History:  Diagnosis Date  . ADHD   . ADHD (attention deficit hyperactivity disorder)   . Attention deficit hyperactivity disorder (ADHD) 07/21/2015  . Bipolar 1 disorder ([facility])   . Bipolar 1 disorder ([facility])   . Chlamydia   . Gonorrhea   . Insomnia 07/21/2015   Past Surgical History:  Procedure Laterality Date  . CHOLECYSTECTOMY     Allergies  Allergen Reactions  . Ativan [Lorazepam] Other (See Comments)    Reaction:  Hallucinations   Current Facility-Administered Medications on File Prior to Encounter  Medication Dose Route Frequency Provider Last Rate Last Admin  . prenatal multivitamin tablet 1 tablet  1 tablet Oral Q1200 Tilda Burrow, MD       Current Outpatient Medications on File Prior to Encounter  Medication Sig Dispense Refill  . ARIPiprazole (ABILIFY) 10 MG tablet Take 10 mg by mouth 2 (two) times daily.     Marland Kitchen atomoxetine (STRATTERA) 40 MG capsule Take 40 mg by mouth daily.     . benztropine (COGENTIN) 0.5 MG tablet Take 1 tablet (0.5 mg total) by mouth 2 (two) times daily. 30 tablet 0    . escitalopram (LEXAPRO) 10 MG tablet Take 10 mg by mouth at bedtime.     . hydrOXYzine (ATARAX/VISTARIL) 10 MG tablet Take 10 mg by mouth 2 (two) times daily as needed for itching or anxiety.    Marland Kitchen ibuprofen (ADVIL) 600 MG tablet Take 1 tablet (600 mg total) by mouth every 6 (six) hours as needed. Take with food 21 tablet 0  . levonorgestrel (MIRENA) 20 MCG/24HR IUD 1 each by Intrauterine route once.    . nitrofurantoin, macrocrystal-monohydrate, (MACROBID) 100 MG capsule Take 1 capsule (100 mg total) by mouth 2 (two) times daily. 10 capsule 0    Social History   Socioeconomic History  . Marital status: Single    Spouse name: Not on file  . Number of children: Not on file  . Years of education: Not on file  . Highest education level: Not on file  Occupational History  . Not on file  Tobacco Use  . Smoking status: Former Smoker    Packs/day: 0.50    Years: 1.00    Pack years: 0.50    Types: Cigarettes  . Smokeless tobacco: Never Used  Substance and Sexual Activity  . Alcohol use: No  . Drug use: Yes    Types: Marijuana  . Sexual activity: Yes    Birth control/protection: I.U.D.  Other Topics Concern  . Not on file  Social History Narrative  . Not on file   Social Determinants of Health   Financial  Resource Strain:   . Difficulty of Paying Living Expenses:   Food Insecurity:   . Worried About Programme researcher, broadcasting/film/video in the Last Year:   . Barista in the Last Year:   Transportation Needs:   . Freight forwarder (Medical):   Marland Kitchen Lack of Transportation (Non-Medical):   Physical Activity:   . Days of Exercise per Week:   . Minutes of Exercise per Session:   Stress:   . Feeling of Stress :   Social Connections:   . Frequency of Communication with Friends and Family:   . Frequency of Social Gatherings with Friends and Family:   . Attends Religious Services:   . Active Member of Clubs or Organizations:   . Attends Banker Meetings:   Marland Kitchen Marital Status:    Intimate Partner Violence:   . Fear of Current or Ex-Partner:   . Emotionally Abused:   Marland Kitchen Physically Abused:   . Sexually Abused:    Family History  Problem Relation Age of Onset  . Diabetes Mother   . Hypertension Mother     OBJECTIVE:  Vitals:   08/30/19 1347 08/30/19 1359  BP: 108/73   Pulse: 63   Resp: 16   Temp: 98.1 F (36.7 C)   TempSrc: Oral   SpO2: 100%   Weight:  154 lb (69.9 kg)  Height:  5\' 1"  (1.549 m)     General appearance: Alert, NAD, appears stated age Head: NCAT Throat: lips, mucosa, and tongue normal; teeth and gums normal Lungs: CTA bilaterally without adventitious breath sounds Heart: regular rate and rhythm.  Radial pulses 2+ symmetrical bilaterally Back: no CVA tenderness Abdomen: soft, non-tender; bowel sounds normal; no masses or organomegaly; no guarding or rebound tenderness GU: declines OR External examination without vulvar lesions or erythema Bimanual exam: Negative for cervical motion or adenexal tenderness; Speculum exam: Thick white/yellow discharge during pelvic exam.  Cervix visualized without erythema or lesions. Cervical swab obtained Skin: warm and dry Psychological:  Alert and cooperative. Normal mood and affect.  LABS:  Results for orders placed or performed during the hospital encounter of 04/20/19  Pregnancy, urine  Result Value Ref Range   Preg Test, Ur NEGATIVE NEGATIVE  Urinalysis, Routine w reflex microscopic  Result Value Ref Range   Color, Urine YELLOW YELLOW   APPearance CLEAR CLEAR   Specific Gravity, Urine 1.010 1.005 - 1.030   pH 6.0 5.0 - 8.0   Glucose, UA NEGATIVE NEGATIVE mg/dL   Hgb urine dipstick SMALL (A) NEGATIVE   Bilirubin Urine NEGATIVE NEGATIVE   Ketones, ur NEGATIVE NEGATIVE mg/dL   Protein, ur NEGATIVE NEGATIVE mg/dL   Nitrite NEGATIVE NEGATIVE   Leukocytes,Ua MODERATE (A) NEGATIVE   RBC / HPF 0-5 0 - 5 RBC/hpf   WBC, UA 6-10 0 - 5 WBC/hpf   Bacteria, UA NONE SEEN NONE SEEN   Squamous  Epithelial / LPF 6-10 0 - 5   Crystals PRESENT (A) NEGATIVE  CBC with Differential  Result Value Ref Range   WBC 7.8 4.0 - 10.5 K/uL   RBC 4.38 3.87 - 5.11 MIL/uL   Hemoglobin 14.1 12.0 - 15.0 g/dL   HCT 04/22/19 42.6 - 83.4 %   MCV 97.5 80.0 - 100.0 fL   MCH 32.2 26.0 - 34.0 pg   MCHC 33.0 30.0 - 36.0 g/dL   RDW 19.6 22.2 - 97.9 %   Platelets 266 150 - 400 K/uL   nRBC 0.0 0.0 - 0.2 %  Neutrophils Relative % 64 %   Neutro Abs 4.9 1.7 - 7.7 K/uL   Lymphocytes Relative 26 %   Lymphs Abs 2.0 0.7 - 4.0 K/uL   Monocytes Relative 5 %   Monocytes Absolute 0.4 0.1 - 1.0 K/uL   Eosinophils Relative 4 %   Eosinophils Absolute 0.3 0.0 - 0.5 K/uL   Basophils Relative 1 %   Basophils Absolute 0.1 0.0 - 0.1 K/uL   Immature Granulocytes 0 %   Abs Immature Granulocytes 0.02 0.00 - 0.07 K/uL  Basic metabolic panel  Result Value Ref Range   Sodium 141 135 - 145 mmol/L   Potassium 3.7 3.5 - 5.1 mmol/L   Chloride 107 98 - 111 mmol/L   CO2 23 22 - 32 mmol/L   Glucose, Bld 79 70 - 99 mg/dL   BUN 8 6 - 20 mg/dL   Creatinine, Ser 0.79 0.44 - 1.00 mg/dL   Calcium 9.3 8.9 - 10.3 mg/dL   GFR calc non Af Amer >60 >60 mL/min   GFR calc Af Amer >60 >60 mL/min   Anion gap 11 5 - 15  Troponin I (High Sensitivity)  Result Value Ref Range   Troponin I (High Sensitivity) <2.0 <18 ng/L    Labs Reviewed  CERVICOVAGINAL ANCILLARY ONLY    ASSESSMENT & PLAN:  1. Screening for STD (sexually transmitted disease)     No orders of the defined types were placed in this encounter.   Pending: Labs Reviewed  CERVICOVAGINAL ANCILLARY ONLY    Discharge instructions Take medications as prescribed and to completion If tests results are positive, please abstain from sexual activity until you and your partner(s) have been treated Follow up with PCP or [place] if symptoms persists Return here or go to ER if you have any new or worsening symptoms fever, chills, nausea, vomiting, abdominal or pelvic  pain, painful intercourse, vaginal discharge, vaginal bleeding, persistent symptoms despite treatment, etc...  Reviewed expectations re: course of current medical issues. Questions answered. Outlined signs and symptoms indicating need for more acute intervention. Patient verbalized understanding. After Visit Summary given.       Emerson Monte, [place] 08/30/19 1442

## 2019-08-30 NOTE — ED Triage Notes (Signed)
Yellow vaginal discharge x 3 days, found out her boyfriend cheated on her and would like a std test.

## 2019-08-30 NOTE — Discharge Instructions (Addendum)
Take medications as prescribed and to completion If tests results are positive, please abstain from sexual activity until you and your partner(s) have been treated Follow up with PCP or [facility] if symptoms persists Return here or go to ER if you have any new or worsening symptoms fever, chills, nausea, vomiting, abdominal or pelvic pain, painful intercourse, vaginal discharge, vaginal bleeding, persistent symptoms despite treatment, etc..Marland Kitchen

## 2019-09-02 ENCOUNTER — Other Ambulatory Visit: Payer: Self-pay

## 2019-09-02 ENCOUNTER — Emergency Department (HOSPITAL_COMMUNITY)
Admission: EM | Admit: 2019-09-02 | Discharge: 2019-09-02 | Disposition: A | Discharge status: Home / Self Care | Payer: BC Managed Care – PPO | Attending: Emergency Medicine | Admitting: Emergency Medicine

## 2019-09-02 ENCOUNTER — Encounter (HOSPITAL_COMMUNITY): Payer: Self-pay | Admitting: *Deleted

## 2019-09-02 DIAGNOSIS — A5901 Trichomonal vulvovaginitis: Secondary | ICD-10-CM | POA: Insufficient documentation

## 2019-09-02 DIAGNOSIS — B9689 Other specified bacterial agents as the cause of diseases classified elsewhere: Secondary | ICD-10-CM | POA: Diagnosis not present

## 2019-09-02 DIAGNOSIS — N739 Female pelvic inflammatory disease, unspecified: Secondary | ICD-10-CM

## 2019-09-02 DIAGNOSIS — N760 Acute vaginitis: Secondary | ICD-10-CM | POA: Diagnosis not present

## 2019-09-02 DIAGNOSIS — Z87891 Personal history of nicotine dependence: Secondary | ICD-10-CM | POA: Insufficient documentation

## 2019-09-02 DIAGNOSIS — R1013 Epigastric pain: Secondary | ICD-10-CM | POA: Diagnosis not present

## 2019-09-02 DIAGNOSIS — R1010 Upper abdominal pain, unspecified: Secondary | ICD-10-CM | POA: Diagnosis not present

## 2019-09-02 DIAGNOSIS — Z888 Allergy status to other drugs, medicaments and biological substances status: Secondary | ICD-10-CM | POA: Insufficient documentation

## 2019-09-02 LAB — CERVICOVAGINAL ANCILLARY ONLY
Bacterial Vaginitis (gardnerella): POSITIVE — AB
Candida Glabrata: NEGATIVE
Candida Vaginitis: NEGATIVE
Chlamydia: POSITIVE — AB
Comment: NEGATIVE
Comment: NEGATIVE
Comment: NEGATIVE
Comment: NEGATIVE
Comment: NEGATIVE
Comment: NORMAL
Neisseria Gonorrhea: NEGATIVE
Trichomonas: POSITIVE — AB

## 2019-09-02 LAB — COMPREHENSIVE METABOLIC PANEL
ALT: 27 U/L (ref 0–44)
AST: 22 U/L (ref 15–41)
Albumin: 4.7 g/dL (ref 3.5–5.0)
Alkaline Phosphatase: 81 U/L (ref 38–126)
Anion gap: 10 (ref 5–15)
BUN: 10 mg/dL (ref 6–20)
CO2: 23 mmol/L (ref 22–32)
Calcium: 9.4 mg/dL (ref 8.9–10.3)
Chloride: 105 mmol/L (ref 98–111)
Creatinine, Ser: 0.78 mg/dL (ref 0.44–1.00)
GFR calc Af Amer: >60 mL/min (ref >60)
GFR calc non Af Amer: >60 mL/min (ref >60)
Glucose, Bld: 101 mg/dL — ABNORMAL HIGH (ref 70–99)
Potassium: 3.5 mmol/L (ref 3.5–5.1)
Sodium: 138 mmol/L (ref 135–145)
Total Bilirubin: 1.6 mg/dL — ABNORMAL HIGH (ref 0.3–1.2)
Total Protein: 7.4 g/dL (ref 6.5–8.1)

## 2019-09-02 LAB — URINALYSIS, ROUTINE W REFLEX MICROSCOPIC
Glucose, UA: NEGATIVE mg/dL
Ketones, ur: 20 mg/dL — AB
Nitrite: NEGATIVE
Protein, ur: 100 mg/dL — AB
Specific Gravity, Urine: 1.027 (ref 1.005–1.030)
Squamous Epithelial / HPF: >50 — ABNORMAL HIGH (ref 0–5)
pH: 6 (ref 5.0–8.0)

## 2019-09-02 LAB — CBC WITH DIFFERENTIAL/PLATELET
Abs Immature Granulocytes: 0.04 10*3/uL (ref 0.00–0.07)
Basophils Absolute: 0.1 10*3/uL (ref 0.0–0.1)
Basophils Relative: 1 %
Eosinophils Absolute: 0.1 10*3/uL (ref 0.0–0.5)
Eosinophils Relative: 1 %
HCT: 42.9 % (ref 36.0–46.0)
Hemoglobin: 14.7 g/dL (ref 12.0–15.0)
Immature Granulocytes: 0 %
Lymphocytes Relative: 21 %
Lymphs Abs: 2.8 10*3/uL (ref 0.7–4.0)
MCH: 32.3 pg (ref 26.0–34.0)
MCHC: 34.3 g/dL (ref 30.0–36.0)
MCV: 94.3 fL (ref 80.0–100.0)
Monocytes Absolute: 1.1 10*3/uL — ABNORMAL HIGH (ref 0.1–1.0)
Monocytes Relative: 8 %
Neutro Abs: 9.3 10*3/uL — ABNORMAL HIGH (ref 1.7–7.7)
Neutrophils Relative %: 69 %
Platelets: 276 10*3/uL (ref 150–400)
RBC: 4.55 MIL/uL (ref 3.87–5.11)
RDW: 11.6 % (ref 11.5–15.5)
WBC: 13.3 10*3/uL — ABNORMAL HIGH (ref 4.0–10.5)
nRBC: 0 % (ref 0.0–0.2)

## 2019-09-02 LAB — HIV ANTIBODY (ROUTINE TESTING W REFLEX): HIV Screen 4th Generation wRfx: NONREACTIVE

## 2019-09-02 LAB — PREGNANCY, URINE: Preg Test, Ur: NEGATIVE

## 2019-09-02 LAB — WET PREP, GENITAL
Sperm: NONE SEEN
Yeast Wet Prep HPF POC: NONE SEEN

## 2019-09-02 LAB — RPR: RPR Ser Ql: NONREACTIVE

## 2019-09-02 MED ORDER — LIDOCAINE VISCOUS HCL 2 % MT SOLN
15.0000 mL | Freq: Once | OROMUCOSAL | Status: AC
  Administered 2019-09-02: 15 mL via ORAL
  Filled 2019-09-02: qty 15

## 2019-09-02 MED ORDER — DOXYCYCLINE HYCLATE 100 MG PO TABS
100.0000 mg | ORAL_TABLET | Freq: Once | ORAL | Status: AC
  Administered 2019-09-02: 100 mg via ORAL
  Filled 2019-09-02: qty 1

## 2019-09-02 MED ORDER — METRONIDAZOLE 500 MG PO TABS
500.0000 mg | ORAL_TABLET | Freq: Once | ORAL | Status: AC
  Administered 2019-09-02: 500 mg via ORAL
  Filled 2019-09-02: qty 1

## 2019-09-02 MED ORDER — DOXYCYCLINE HYCLATE 100 MG PO CAPS: 100.0000 mg | ORAL_CAPSULE | Freq: Two times a day (BID) | ORAL | 0 refills | Status: DC

## 2019-09-02 MED ORDER — METRONIDAZOLE 500 MG PO TABS
500.0000 mg | ORAL_TABLET | Freq: Two times a day (BID) | ORAL | 0 refills | Status: DC
Start: 2019-09-02 — End: 2020-03-16

## 2019-09-02 MED ORDER — ONDANSETRON HCL 4 MG/2ML IJ SOLN
4.0000 mg | Freq: Once | INTRAMUSCULAR | Status: AC
  Administered 2019-09-02: 4 mg via INTRAVENOUS
  Filled 2019-09-02: qty 2

## 2019-09-02 MED ORDER — ALUM & MAG HYDROXIDE-SIMETH 200-200-20 MG/5ML PO SUSP
30.0000 mL | Freq: Once | ORAL | Status: AC
  Administered 2019-09-02: 30 mL via ORAL
  Filled 2019-09-02: qty 30

## 2019-09-02 MED ORDER — CEFTRIAXONE SODIUM 500 MG IJ SOLR
500.0000 mg | Freq: Once | INTRAMUSCULAR | Status: AC
  Administered 2019-09-02: 500 mg via INTRAMUSCULAR
  Filled 2019-09-02: qty 500

## 2019-09-02 MED ORDER — PANTOPRAZOLE SODIUM 40 MG PO TBEC
40.0000 mg | DELAYED_RELEASE_TABLET | Freq: Every day | ORAL | 0 refills | Status: DC
Start: 2019-09-02 — End: 2020-03-16

## 2019-09-02 MED ORDER — PANTOPRAZOLE SODIUM 40 MG PO TBEC
40.0000 mg | DELAYED_RELEASE_TABLET | Freq: Once | ORAL | Status: AC
  Administered 2019-09-02: 40 mg via ORAL
  Filled 2019-09-02: qty 1

## 2019-09-02 NOTE — ED Triage Notes (Signed)
Pt c/o abd pain, vaginal discharge that started three days ago, was seen at urgent care but states " they did not tell me anything"

## 2019-09-02 NOTE — ED Provider Notes (Signed)
[facility] EMERGENCY DEPARTMENT Provider Note   CSN: [id number] Arrival date & time: 09/02/19  0111   History Chief Complaint  Patient presents with  . Abdominal Pain    Diana Pennington is a 19 y.o. female.  The history is provided by the patient.  Abdominal Pain She has history of bipolar disorder, attention deficit disorder and comes in with complaints of vaginal discharge, upper abdominal pain, pelvic pain.  Symptoms have been present for about 5 days.  She had been seen at urgent care and was supposed to have the specimen sent to look for sexually transmitted infections, but she has not been able to find any report.  She is complaining of a yellowish vaginal discharge with occasional vaginal itching.  There is bilateral suprapubic pain and also pain that started off in the right upper quadrant and is moved to the epigastric area.  There is no radiation of pain to the back.  There has been nausea but no vomiting.  She denies any urinary urgency, frequency, tenesmus, dysuria.  She denies constipation or diarrhea.  Last menses was May 10, and she has had some spotting over the last week which is unusual for her.  She uses an IUD for contraception.  Of note, she is status post cholecystectomy.  Past Medical History:  Diagnosis Date  . ADHD   . ADHD (attention deficit hyperactivity disorder)   . Attention deficit hyperactivity disorder (ADHD) 07/21/2015  . Bipolar 1 disorder (HCC)   . Bipolar 1 disorder (HCC)   . Chlamydia   . Gonorrhea   . Insomnia 07/21/2015    Patient Active Problem List   Diagnosis Date Noted  . PID (acute pelvic inflammatory disease) 04/11/2017  . Indication for care in labor or delivery 04/23/2016  . Bipolar affect, depressed (HCC) 07/21/2015  . Attention deficit hyperactivity disorder (ADHD) 07/21/2015  . Insomnia 07/21/2015    Past Surgical History:  Procedure Laterality Date  . CHOLECYSTECTOMY       OB History    Gravida  1   Para  1   Term  1   Preterm      AB      Living  1     SAB      TAB      Ectopic      Multiple  0   Live Births  1           Family History  Problem Relation Age of Onset  . Diabetes Mother   . Hypertension Mother     Social History   Tobacco Use  . Smoking status: Former Smoker    Packs/day: 0.50    Years: 1.00    Pack years: 0.50    Types: Cigarettes  . Smokeless tobacco: Never Used  Substance Use Topics  . Alcohol use: No  . Drug use: Yes    Types: Marijuana    Home Medications Prior to Admission medications   Medication Sig Start Date End Date Taking? Authorizing Provider  ARIPiprazole (ABILIFY) 10 MG tablet Take 10 mg by mouth 2 (two) times daily.  10/12/16   [provider]  atomoxetine (STRATTERA) 40 MG capsule Take 40 mg by mouth daily.  07/11/17   [provider]  benztropine (COGENTIN) 0.5 MG tablet Take 1 tablet (0.5 mg total) by mouth 2 (two) times daily. 07/29/15   [name], MD  escitalopram (LEXAPRO) 10 MG tablet Take 10 mg by mouth at bedtime.  10/27/16   [provider]  hydrOXYzine (ATARAX/VISTARIL) 10 MG tablet Take 10 mg by mouth 2 (two) times daily as needed for itching or anxiety.    [provider]  ibuprofen (ADVIL) 600 MG tablet Take 1 tablet (600 mg total) by mouth every 6 (six) hours as needed. Take with food 04/20/19   Triplett, Tammy, PA-C  levonorgestrel (MIRENA) 20 MCG/24HR IUD 1 each by Intrauterine route once.    [provider]  nitrofurantoin, macrocrystal-monohydrate, (MACROBID) 100 MG capsule Take 1 capsule (100 mg total) by mouth 2 (two) times daily. 03/18/19   Eber Hong, MD    Allergies    Ativan [lorazepam]  Review of Systems   Review of Systems  Gastrointestinal: Positive for abdominal pain.  All other systems reviewed and are negative.   Physical Exam Updated Vital Signs BP 113/74   Pulse 98   Temp 98 F (36.7 C) (Oral)   Resp 18   Ht 5\' 1"  (1.549 m)   Wt 69.9 kg    LMP 08/15/2019   SpO2 98%   BMI 29.10 kg/m   Physical Exam Vitals and nursing note reviewed.   19 year old female, resting comfortably and in no acute distress. Vital signs are normal. Oxygen saturation is 98%, which is normal. Head is normocephalic and atraumatic. PERRLA, EOMI. Oropharynx is clear. Neck is nontender and supple without adenopathy or JVD. Back is nontender and there is no CVA tenderness. Lungs are clear without rales, wheezes, or rhonchi. Chest is nontender. Heart has regular rate and rhythm without murmur. Abdomen is soft, flat, with moderate epigastric and right upper quadrant tenderness, moderate suprapubic tenderness.  There are no masses or hepatosplenomegaly and peristalsis is normoactive. Pelvic: Normal external female genitalia, no vaginal bleeding, IUD string in place.  Mild to moderate amount of whitish vaginal discharge.  On bimanual exam, there is marked tenderness diffusely and marked cervical motion tenderness.  Unable to assess uterine size because of degree of tenderness. Extremities have no cyanosis or edema, full range of motion is present. Skin is warm and dry without rash. Neurologic: Mental status is normal, cranial nerves are intact, there are no motor or sensory deficits.  ED Results / Procedures / Treatments   Labs (all labs ordered are listed, but only abnormal results are displayed) Labs Reviewed  WET PREP, GENITAL - Abnormal; Notable for the following components:      Result Value   Trich, Wet Prep PRESENT (*)    Clue Cells Wet Prep HPF POC PRESENT (*)    WBC, Wet Prep HPF POC MANY (*)    All other components within normal limits  URINALYSIS, ROUTINE W REFLEX MICROSCOPIC - Abnormal; Notable for the following components:   Color, Urine AMBER (*)    APPearance CLOUDY (*)    Hgb urine dipstick MODERATE (*)    Bilirubin Urine SMALL (*)    Ketones, ur 20 (*)    Protein, ur 100 (*)    Leukocytes,Ua LARGE (*)    Bacteria, UA RARE (*)     Squamous Epithelial / LPF >50 (*)    All other components within normal limits  CBC WITH DIFFERENTIAL/PLATELET - Abnormal; Notable for the following components:   WBC 13.3 (*)    Neutro Abs 9.3 (*)    Monocytes Absolute 1.1 (*)    All other components within normal limits  COMPREHENSIVE METABOLIC PANEL - Abnormal; Notable for the following components:   Glucose, Bld 101 (*)    Total Bilirubin 1.6 (*)  All other components within normal limits  PREGNANCY, URINE  RPR  HIV ANTIBODY (ROUTINE TESTING W REFLEX)  GC/CHLAMYDIA PROBE AMP ([place]) NOT AT [facility]   Procedures Procedures   Medications Ordered in ED Medications  pantoprazole (PROTONIX) EC tablet 40 mg (has no administration in time range)  cefTRIAXone (ROCEPHIN) injection 500 mg (500 mg Intramuscular Given 09/02/19 0521)  doxycycline (VIBRA-TABS) tablet 100 mg (100 mg Oral Given 09/02/19 0520)  alum & mag hydroxide-simeth (MAALOX/MYLANTA) 200-200-20 MG/5ML suspension 30 mL (30 mLs Oral Given 09/02/19 0520)    And  lidocaine (XYLOCAINE) 2 % viscous mouth solution 15 mL (15 mLs Oral Given 09/02/19 0520)  ondansetron (ZOFRAN) injection 4 mg (4 mg Intravenous Given 09/02/19 0534)  metroNIDAZOLE (FLAGYL) tablet 500 mg (500 mg Oral Given 09/02/19 1191)   ED Course  I have reviewed the triage vital signs and the nursing notes.  Pertinent lab results that were available during my care of the patient were reviewed by me and considered in my medical decision making (see chart for details).  MDM Rules/Calculators/A&P Pelvic pain which clinically appears to be PID.  Doubt ovarian cyst.  Epigastric pain appears to be either GERD, gastritis, or peptic ulcer disease.  Will give therapeutic trial of GI cocktail.  Specimens have been sent for evaluation for STIs.  Labs show mild leukocytosis without a left shift.  Urinalysis as 21-50 RBCs and greater than 50 squamous epithelial cells indicating contaminated specimen.  She will be treated empirically  for pelvic inflammatory disease.  She is given injection of ceftriaxone and a dose of oral doxycycline.  Old records were reviewed confirming urgent care visit on 6/4 and report that vaginal swabs have been ordered, but I do not see any results in the system.  Wet prep is positive for clue cells and trichomonas.  She is given a dose of metronidazole.  She had good relief of upper abdominal pain with GI cocktail, she is given a dose of pantoprazole.  She is discharged with prescriptions for doxycycline, metronidazole, pantoprazole.  Told to take over-the-counter analgesics as needed for pain.  Follow-up with GYN following completion of course of antibiotics, follow-up with GI regarding upper abdominal pain.  Return precautions discussed.  Final Clinical Impression(s) / ED Diagnoses Final diagnoses:  Pelvic inflammatory disease (PID)  Trichomonal vaginitis  Bacterial vaginosis  Epigastric pain    Rx / DC Orders ED Discharge Orders         Ordered    doxycycline (VIBRAMYCIN) 100 MG capsule  2 times daily     09/02/19 0600    metroNIDAZOLE (FLAGYL) 500 MG tablet  2 times daily     09/02/19 0600    pantoprazole (PROTONIX) 40 MG tablet  Daily     09/02/19 4782           Delora Fuel, MD 95/62/13 [phone]

## 2019-09-02 NOTE — Discharge Instructions (Signed)
Take ibuprofen and acetaminophen as needed for pain.  Return if you are having any problems.  Please contact all sexual partners - they need to be treated for trichomonas and whatever other infections the cultures show you have.

## 2019-09-03 ENCOUNTER — Encounter (HOSPITAL_COMMUNITY): Payer: Self-pay | Admitting: Emergency Medicine

## 2019-09-03 ENCOUNTER — Other Ambulatory Visit: Payer: Self-pay

## 2019-09-03 ENCOUNTER — Telehealth (HOSPITAL_COMMUNITY): Payer: Self-pay | Admitting: Orthopedic Surgery

## 2019-09-03 ENCOUNTER — Emergency Department (HOSPITAL_COMMUNITY): Payer: BC Managed Care – PPO

## 2019-09-03 ENCOUNTER — Emergency Department (HOSPITAL_COMMUNITY)
Admission: EM | Admit: 2019-09-03 | Discharge: 2019-09-03 | Disposition: A | Discharge status: Home / Self Care | Payer: BC Managed Care – PPO | Attending: Emergency Medicine | Admitting: Emergency Medicine

## 2019-09-03 DIAGNOSIS — Z79899 Other long term (current) drug therapy: Secondary | ICD-10-CM | POA: Insufficient documentation

## 2019-09-03 DIAGNOSIS — N730 Acute parametritis and pelvic cellulitis: Secondary | ICD-10-CM

## 2019-09-03 DIAGNOSIS — Z87891 Personal history of nicotine dependence: Secondary | ICD-10-CM | POA: Insufficient documentation

## 2019-09-03 DIAGNOSIS — I10 Essential (primary) hypertension: Secondary | ICD-10-CM | POA: Diagnosis not present

## 2019-09-03 DIAGNOSIS — R079 Chest pain, unspecified: Secondary | ICD-10-CM | POA: Diagnosis not present

## 2019-09-03 DIAGNOSIS — R109 Unspecified abdominal pain: Secondary | ICD-10-CM | POA: Diagnosis not present

## 2019-09-03 DIAGNOSIS — Z975 Presence of (intrauterine) contraceptive device: Secondary | ICD-10-CM | POA: Diagnosis not present

## 2019-09-03 DIAGNOSIS — N739 Female pelvic inflammatory disease, unspecified: Secondary | ICD-10-CM | POA: Insufficient documentation

## 2019-09-03 LAB — URINALYSIS, ROUTINE W REFLEX MICROSCOPIC
Bilirubin Urine: NEGATIVE
Glucose, UA: NEGATIVE mg/dL
Ketones, ur: NEGATIVE mg/dL
Nitrite: NEGATIVE
Protein, ur: NEGATIVE mg/dL
Specific Gravity, Urine: 1.01 (ref 1.005–1.030)
WBC, UA: >50 WBC/hpf — ABNORMAL HIGH (ref 0–5)
pH: 7 (ref 5.0–8.0)

## 2019-09-03 LAB — CBC WITH DIFFERENTIAL/PLATELET
Abs Immature Granulocytes: 0.03 10*3/uL (ref 0.00–0.07)
Basophils Absolute: 0 10*3/uL (ref 0.0–0.1)
Basophils Relative: 0 %
Eosinophils Absolute: 0.2 10*3/uL (ref 0.0–0.5)
Eosinophils Relative: 2 %
HCT: 39.3 % (ref 36.0–46.0)
Hemoglobin: 13.3 g/dL (ref 12.0–15.0)
Immature Granulocytes: 0 %
Lymphocytes Relative: 15 %
Lymphs Abs: 1.8 10*3/uL (ref 0.7–4.0)
MCH: 32.6 pg (ref 26.0–34.0)
MCHC: 33.8 g/dL (ref 30.0–36.0)
MCV: 96.3 fL (ref 80.0–100.0)
Monocytes Absolute: 1 10*3/uL (ref 0.1–1.0)
Monocytes Relative: 8 %
Neutro Abs: 8.9 10*3/uL — ABNORMAL HIGH (ref 1.7–7.7)
Neutrophils Relative %: 75 %
Platelets: 252 10*3/uL (ref 150–400)
RBC: 4.08 MIL/uL (ref 3.87–5.11)
RDW: 12.1 % (ref 11.5–15.5)
WBC: 11.9 10*3/uL — ABNORMAL HIGH (ref 4.0–10.5)
nRBC: 0 % (ref 0.0–0.2)

## 2019-09-03 LAB — GC/CHLAMYDIA PROBE AMP (~~LOC~~) NOT AT ARMC
Chlamydia: POSITIVE — AB
Comment: NEGATIVE
Comment: NORMAL
Neisseria Gonorrhea: NEGATIVE

## 2019-09-03 LAB — COMPREHENSIVE METABOLIC PANEL
ALT: 18 U/L (ref 0–44)
AST: 16 U/L (ref 15–41)
Albumin: 4.2 g/dL (ref 3.5–5.0)
Alkaline Phosphatase: 78 U/L (ref 38–126)
Anion gap: 11 (ref 5–15)
BUN: 8 mg/dL (ref 6–20)
CO2: 23 mmol/L (ref 22–32)
Calcium: 9 mg/dL (ref 8.9–10.3)
Chloride: 104 mmol/L (ref 98–111)
Creatinine, Ser: 0.79 mg/dL (ref 0.44–1.00)
GFR calc Af Amer: >60 mL/min (ref >60)
GFR calc non Af Amer: >60 mL/min (ref >60)
Glucose, Bld: 94 mg/dL (ref 70–99)
Potassium: 3.7 mmol/L (ref 3.5–5.1)
Sodium: 138 mmol/L (ref 135–145)
Total Bilirubin: 0.3 mg/dL (ref 0.3–1.2)
Total Protein: 7.1 g/dL (ref 6.5–8.1)

## 2019-09-03 LAB — LIPASE, BLOOD: Lipase: 49 U/L (ref 11–51)

## 2019-09-03 LAB — PREGNANCY, URINE: Preg Test, Ur: NEGATIVE

## 2019-09-03 MED ORDER — HYDROCODONE-ACETAMINOPHEN 5-325 MG PO TABS
1.0000 | ORAL_TABLET | Freq: Once | ORAL | Status: AC
  Administered 2019-09-03: 1 via ORAL
  Filled 2019-09-03: qty 1

## 2019-09-03 MED ORDER — ONDANSETRON 4 MG PO TBDP
4.0000 mg | ORAL_TABLET | Freq: Three times a day (TID) | ORAL | 0 refills | Status: DC | PRN
Start: 2019-09-03 — End: 2020-03-16

## 2019-09-03 MED ORDER — ONDANSETRON HCL 4 MG/2ML IJ SOLN
4.0000 mg | Freq: Once | INTRAMUSCULAR | Status: AC
  Administered 2019-09-03: 4 mg via INTRAVENOUS
  Filled 2019-09-03: qty 2

## 2019-09-03 MED ORDER — KETOROLAC TROMETHAMINE 30 MG/ML IJ SOLN
15.0000 mg | Freq: Once | INTRAMUSCULAR | Status: AC
  Administered 2019-09-03: 15 mg via INTRAVENOUS
  Filled 2019-09-03: qty 1

## 2019-09-03 MED ORDER — HYDROCODONE-ACETAMINOPHEN 5-325 MG PO TABS: 1.0000 | ORAL_TABLET | ORAL | 0 refills | Status: DC | PRN

## 2019-09-03 NOTE — ED Triage Notes (Addendum)
Pt c/o RT abdominal/ribcage pain x 4 days. States she was evaluated in ED a couple of days ago, but pain has intensified. Pt was evaluated yesterday and had labs/UA performed. Denies n/v/d.

## 2019-09-03 NOTE — ED Provider Notes (Signed)
[facility] EMERGENCY DEPARTMENT Provider Note   CSN: [id number] Arrival date & time: 09/03/19  1139     History Chief Complaint  Patient presents with  . Abdominal Pain    Diana Pennington is a 19 y.o. female returning for further evaluation of her abdominal pain.  She was seen here yesterday at which time she had complaints of both upper and lower abdominal pain along with vaginal discharge.  She was diagnosed with pelvic inflammatory disease, trichomoniasis and bacterial vaginosis.  Her cultures have resulted and confirmed chlamydia infection.  She has started the doxycycline and the Flagyl she was prescribed this morning, she was also given a dose of these medications yesterday and an IM injection of Rocephin.  She has mild improvement in her pelvic pain but has more severe pain in her right upper quadrant.  She endorses nausea.  Pain is worse with movement and palpation.  She has had no vomiting, denies back pain, no dysuria or hematuria.  Of note she is status post cholecystectomy.  HPI     Past Medical History:  Diagnosis Date  . ADHD   . ADHD (attention deficit hyperactivity disorder)   . Attention deficit hyperactivity disorder (ADHD) 07/21/2015  . Bipolar 1 disorder (HCC)   . Bipolar 1 disorder (HCC)   . Chlamydia   . Gonorrhea   . Insomnia 07/21/2015    Patient Active Problem List   Diagnosis Date Noted  . PID (acute pelvic inflammatory disease) 04/11/2017  . Indication for care in labor or delivery 04/23/2016  . Bipolar affect, depressed (HCC) 07/21/2015  . Attention deficit hyperactivity disorder (ADHD) 07/21/2015  . Insomnia 07/21/2015    Past Surgical History:  Procedure Laterality Date  . CHOLECYSTECTOMY       OB History    Gravida  1   Para  1   Term  1   Preterm      AB      Living  1     SAB      TAB      Ectopic      Multiple  0   Live Births  1           Family History  Problem Relation Age of Onset  . Diabetes Mother   .  Hypertension Mother     Social History   Tobacco Use  . Smoking status: Former Smoker    Packs/day: 0.50    Years: 1.00    Pack years: 0.50    Types: Cigarettes  . Smokeless tobacco: Never Used  Substance Use Topics  . Alcohol use: No  . Drug use: Yes    Types: Marijuana    Home Medications Prior to Admission medications   Medication Sig Start Date End Date Taking? Authorizing Provider  doxycycline (VIBRAMYCIN) 100 MG capsule Take 1 capsule (100 mg total) by mouth 2 (two) times daily. 09/02/19  Yes Dione Booze, MD  ibuprofen (ADVIL) 600 MG tablet Take 1 tablet (600 mg total) by mouth every 6 (six) hours as needed. Take with food Patient taking differently: Take 800 mg by mouth every 6 (six) hours as needed. Take with food 04/20/19  Yes Triplett, Tammy, PA-C  levonorgestrel (MIRENA) 20 MCG/24HR IUD 1 each by Intrauterine route once.   Yes [provider]  metroNIDAZOLE (FLAGYL) 500 MG tablet Take 1 tablet (500 mg total) by mouth 2 (two) times daily. 09/02/19  Yes Dione Booze, MD  pantoprazole (PROTONIX) 40 MG tablet Take 1 tablet (40  mg total) by mouth daily. 09/02/19  Yes Dione Booze, MD  benztropine (COGENTIN) 0.5 MG tablet Take 1 tablet (0.5 mg total) by mouth 2 (two) times daily. Patient not taking: Reported on 09/03/2019 07/29/15   [name], MD  HYDROcodone-acetaminophen (NORCO/VICODIN) 5-325 MG tablet Take 1 tablet by mouth every 4 (four) hours as needed for moderate pain. 09/03/19   Darbi Chandran, Raynelle Fanning, PA-C  ondansetron (ZOFRAN ODT) 4 MG disintegrating tablet Take 1 tablet (4 mg total) by mouth every 8 (eight) hours as needed for nausea or vomiting. 09/03/19   Burgess Amor, PA-C    Allergies    Ativan [lorazepam]  Review of Systems   Review of Systems  Constitutional: Negative for chills and fever.  HENT: Negative for congestion and sore throat.   Eyes: Negative.   Respiratory: Negative for chest tightness and shortness of breath.   Cardiovascular: Negative for  chest pain.  Gastrointestinal: Positive for abdominal pain and nausea. Negative for vomiting.  Genitourinary: Positive for pelvic pain and vaginal discharge. Negative for dysuria, flank pain and hematuria.  Musculoskeletal: Negative for arthralgias, joint swelling and neck pain.  Skin: Negative.  Negative for rash and wound.  Neurological: Negative for dizziness, weakness, light-headedness, numbness and headaches.  Psychiatric/Behavioral: Negative.     Physical Exam Updated Vital Signs BP 102/75 (BP Location: Left Arm)   Pulse 60   Temp 98 F (36.7 C) (Oral)   Resp 18   Ht 5\' 1"  (1.549 m)   Wt 69.9 kg   LMP 08/15/2019   SpO2 100%   BMI 29.10 kg/m   Physical Exam Vitals and nursing note reviewed.  Constitutional:      Appearance: She is well-developed.  HENT:     Head: Normocephalic and atraumatic.  Eyes:     Conjunctiva/sclera: Conjunctivae normal.  Cardiovascular:     Rate and Rhythm: Normal rate and regular rhythm.     Heart sounds: Normal heart sounds.  Pulmonary:     Effort: Pulmonary effort is normal.     Breath sounds: Normal breath sounds. No wheezing.  Abdominal:     General: Bowel sounds are normal.     Palpations: Abdomen is soft.     Tenderness: There is abdominal tenderness in the right upper quadrant, right lower quadrant and suprapubic area.  Musculoskeletal:        General: Normal range of motion.     Cervical back: Normal range of motion.  Skin:    General: Skin is warm and dry.  Neurological:     Mental Status: She is alert.     ED Results / Procedures / Treatments   Labs (all labs ordered are listed, but only abnormal results are displayed) Labs Reviewed  CBC WITH DIFFERENTIAL/PLATELET - Abnormal; Notable for the following components:      Result Value   WBC 11.9 (*)    Neutro Abs 8.9 (*)    All other components within normal limits  URINALYSIS, ROUTINE W REFLEX MICROSCOPIC - Abnormal; Notable for the following components:   Hgb urine  dipstick SMALL (*)    Leukocytes,Ua MODERATE (*)    WBC, UA >50 (*)    Bacteria, UA RARE (*)    All other components within normal limits  COMPREHENSIVE METABOLIC PANEL  LIPASE, BLOOD  PREGNANCY, URINE    EKG EKG Interpretation  Date/Time:  Tuesday September 03 2019 14:46:57 EDT Ventricular Rate:  70 PR Interval:    QRS Duration: 93 QT Interval:  384 QTC Calculation: 415 R  Axis:   91 Text Interpretation: Sinus rhythm Borderline right axis deviation Borderline Q waves in lateral leads No significant change since last tracing Confirmed by Fredia Sorrow [phone]) on 09/03/2019 3:17:50 PM   Radiology DG Chest Port 1 View  Result Date: 09/03/2019 CLINICAL DATA:  Right-sided chest pain x4 days. EXAM: PORTABLE CHEST 1 VIEW COMPARISON:  April 20, 2019 FINDINGS: The heart size and mediastinal contours are within normal limits. Both lungs are clear. The visualized skeletal structures are unremarkable. IMPRESSION: No active disease. Electronically Signed   By: Virgina Norfolk M.D.   On: 09/03/2019 15:17    Procedures Procedures (including critical care time)  Medications Ordered in ED Medications  ketorolac (TORADOL) 30 MG/ML injection 15 mg (15 mg Intravenous Given 09/03/19 1517)  ondansetron (ZOFRAN) injection 4 mg (4 mg Intravenous Given 09/03/19 1516)  HYDROcodone-acetaminophen (NORCO/VICODIN) 5-325 MG per tablet 1 tablet (1 tablet Oral Given 09/03/19 1718)    ED Course  I have reviewed the triage vital signs and the nursing notes.  Pertinent labs & imaging results that were available during my care of the patient were reviewed by me and considered in my medical decision making (see chart for details).    MDM Rules/Calculators/A&P                     Pt currently being treated for PID, also with trichomonas and bv,  labs reviewed and her culture is positive for chlamydia.  She has continued pain in her lower pelvis but slightly better after starting abx.  No improvement in RUQ pain.   Labs stable specifically LFT's, lipase with improving wbc count.  Pt has had lap chole.  Suspect this is referred pain from her PID.  She was given toradol and zofran with some pain improvement.  Prescribed hydrocodone and zofran after review of the [place] narc database.  Strict return precautions outlined.   Final Clinical Impression(s) / ED Diagnoses Final diagnoses:  PID (acute pelvic inflammatory disease)    Rx / DC Orders ED Discharge Orders         Ordered    HYDROcodone-acetaminophen (NORCO/VICODIN) 5-325 MG tablet  Every 4 hours PRN,   Status:  Discontinued     09/03/19 1717    ondansetron (ZOFRAN ODT) 4 MG disintegrating tablet  Every 8 hours PRN     09/03/19 1717    HYDROcodone-acetaminophen (NORCO/VICODIN) 5-325 MG tablet  Every 4 hours PRN     09/03/19 1720           Evalee Jefferson, PA-C 09/03/19 1859    Fredia Sorrow, MD 09/16/19 [phone]

## 2019-09-03 NOTE — Discharge Instructions (Addendum)
Your lab tests are normal today.  I suspect your pain is referred pain from your pelvic infection.  Complete the entire course of the antibiotics you are prescribed yesterday.  I have prescribed you a small quantity of pain and nausea medicine to help you with your symptoms.  Do not drive within 4 hours of taking hydrocodone.  Follow-up with your primary MD for recheck of your symptoms this week if you do not continue to improve with this treatment plan.  If you are unable to see your doctor and you feel you are getting worse return here for recheck of your symptoms.

## 2020-03-16 ENCOUNTER — Other Ambulatory Visit: Payer: Self-pay

## 2020-03-16 ENCOUNTER — Encounter (HOSPITAL_COMMUNITY): Payer: Self-pay | Admitting: *Deleted

## 2020-03-16 ENCOUNTER — Emergency Department (HOSPITAL_COMMUNITY)
Admission: EM | Admit: 2020-03-16 | Discharge: 2020-03-16 | Disposition: A | Discharge status: Home / Self Care | Payer: Medicaid Other | Attending: Emergency Medicine | Admitting: Emergency Medicine

## 2020-03-16 DIAGNOSIS — Z87891 Personal history of nicotine dependence: Secondary | ICD-10-CM | POA: Insufficient documentation

## 2020-03-16 DIAGNOSIS — F1590 Other stimulant use, unspecified, uncomplicated: Secondary | ICD-10-CM | POA: Insufficient documentation

## 2020-03-16 DIAGNOSIS — N898 Other specified noninflammatory disorders of vagina: Secondary | ICD-10-CM

## 2020-03-16 LAB — WET PREP, GENITAL
Clue Cells Wet Prep HPF POC: NONE SEEN
Sperm: NONE SEEN
Trich, Wet Prep: NONE SEEN
Yeast Wet Prep HPF POC: NONE SEEN

## 2020-03-16 LAB — POC URINE PREG, ED: Preg Test, Ur: NEGATIVE

## 2020-03-16 NOTE — ED Triage Notes (Signed)
Vaginal discharge onset yesterday

## 2020-03-16 NOTE — ED Provider Notes (Signed)
[facility] EMERGENCY DEPARTMENT Provider Note   CSN: [id number] Arrival date & time: 03/16/20  1735     History Chief Complaint  Patient presents with  . Vaginal Discharge    Diana Pennington is a 19 y.o. female with a history of ADHD, bipolar 1 disorder, and prior PID who presents to the emergency department with complaints of vaginal discharge that started yesterday.  Patient states discharge is white to yellow, nonpruritic, nonpainful.  No alleviating or aggravating factors.  Did have unprotected sex with 2 partners separately recently.  No known exposures to STDs.  She has had prior gonorrhea and chlamydia, this does not feel similar.  She denies fever, chills, vaginal bleeding, pelvic pain, dysuria, frequency, or urgency.   HPI     Past Medical History:  Diagnosis Date  . ADHD   . ADHD (attention deficit hyperactivity disorder)   . Attention deficit hyperactivity disorder (ADHD) 07/21/2015  . Bipolar 1 disorder (HCC)   . Bipolar 1 disorder (HCC)   . Chlamydia   . Gonorrhea   . Insomnia 07/21/2015    Patient Active Problem List   Diagnosis Date Noted  . PID (acute pelvic inflammatory disease) 04/11/2017  . Indication for care in labor or delivery 04/23/2016  . Bipolar affect, depressed (HCC) 07/21/2015  . Attention deficit hyperactivity disorder (ADHD) 07/21/2015  . Insomnia 07/21/2015    Past Surgical History:  Procedure Laterality Date  . CHOLECYSTECTOMY       OB History    Gravida  1   Para  1   Term  1   Preterm      AB      Living  1     SAB      IAB      Ectopic      Multiple  0   Live Births  1           Family History  Problem Relation Age of Onset  . Diabetes Mother   . Hypertension Mother     Social History   Tobacco Use  . Smoking status: Former Smoker    Packs/day: 0.50    Years: 1.00    Pack years: 0.50    Types: Cigarettes  . Smokeless tobacco: Never Used  Vaping Use  . Vaping Use: Every day  . Substances:  Nicotine, Flavoring  Substance Use Topics  . Alcohol use: No  . Drug use: Yes    Types: Marijuana    Home Medications Prior to Admission medications   Medication Sig Start Date End Date Taking? Authorizing Provider  benztropine (COGENTIN) 0.5 MG tablet Take 1 tablet (0.5 mg total) by mouth 2 (two) times daily. Patient not taking: Reported on 09/03/2019 07/29/15   [name], MD  doxycycline (VIBRAMYCIN) 100 MG capsule Take 1 capsule (100 mg total) by mouth 2 (two) times daily. 09/02/19   Dione Booze, MD  HYDROcodone-acetaminophen (NORCO/VICODIN) 5-325 MG tablet Take 1 tablet by mouth every 4 (four) hours as needed for moderate pain. 09/03/19   Burgess Amor, PA-C  ibuprofen (ADVIL) 600 MG tablet Take 1 tablet (600 mg total) by mouth every 6 (six) hours as needed. Take with food Patient taking differently: Take 800 mg by mouth every 6 (six) hours as needed. Take with food 04/20/19   Triplett, Tammy, PA-C  levonorgestrel (MIRENA) 20 MCG/24HR IUD 1 each by Intrauterine route once.    [provider]  metroNIDAZOLE (FLAGYL) 500 MG tablet Take 1 tablet (500 mg total) by  mouth 2 (two) times daily. 09/02/19   Dione Booze, MD  ondansetron (ZOFRAN ODT) 4 MG disintegrating tablet Take 1 tablet (4 mg total) by mouth every 8 (eight) hours as needed for nausea or vomiting. 09/03/19   Burgess Amor, PA-C  pantoprazole (PROTONIX) 40 MG tablet Take 1 tablet (40 mg total) by mouth daily. 09/02/19   Dione Booze, MD    Allergies    Ativan [lorazepam]  Review of Systems   Review of Systems  Constitutional: Negative for chills and fever.  Respiratory: Negative for shortness of breath.   Cardiovascular: Negative for chest pain.  Gastrointestinal: Negative for abdominal pain, nausea and vomiting.  Genitourinary: Positive for vaginal discharge. Negative for dysuria, flank pain, hematuria, pelvic pain, urgency and vaginal bleeding.  All other systems reviewed and are negative.   Physical  Exam Updated Vital Signs BP 109/64 (BP Location: Right Arm)   Pulse 72   Temp 98.5 F (36.9 C) (Oral)   Resp 16   SpO2 98%   Physical Exam Vitals and nursing note reviewed. Exam conducted with a chaperone present.  Constitutional:      General: She is not in acute distress.    Appearance: She is well-developed. She is not toxic-appearing.  HENT:     Head: Normocephalic and atraumatic.  Eyes:     General:        Right eye: No discharge.        Left eye: No discharge.     Conjunctiva/sclera: Conjunctivae normal.  Cardiovascular:     Rate and Rhythm: Normal rate and regular rhythm.  Pulmonary:     Effort: Pulmonary effort is normal. No respiratory distress.     Breath sounds: Normal breath sounds. No wheezing, rhonchi or rales.  Abdominal:     General: There is no distension.     Palpations: Abdomen is soft.     Tenderness: There is no abdominal tenderness. There is no right CVA tenderness, left CVA tenderness, guarding or rebound.  Genitourinary:    Labia:        Right: No rash, tenderness or lesion.        Left: No rash, tenderness or lesion.      Cervix: No cervical motion tenderness.     Adnexa:        Right: No mass, tenderness or fullness.         Left: No mass, tenderness or fullness.       Comments: IUD strings visible @ cervical os.  White to clear thin vaginal discharge present- mild amount.  Musculoskeletal:     Cervical back: Neck supple.  Skin:    General: Skin is warm and dry.     Findings: No rash.  Neurological:     Mental Status: She is alert.     Comments: Clear speech.   Psychiatric:        Behavior: Behavior normal.     ED Results / Procedures / Treatments   Labs (all labs ordered are listed, but only abnormal results are displayed) Labs Reviewed  WET PREP, GENITAL - Abnormal; Notable for the following components:      Result Value   WBC, Wet Prep HPF POC MODERATE (*)    All other components within normal limits  RPR  HIV ANTIBODY  (ROUTINE TESTING W REFLEX)  POC URINE PREG, ED  GC/CHLAMYDIA PROBE AMP ([place]) NOT AT [facility]    EKG None  Radiology No results found.  Procedures Procedures (including critical care time)  Medications Ordered in ED Medications - No data to display  ED Course  I have reviewed the triage vital signs and the nursing notes.  Pertinent labs & imaging results that were available during my care of the patient were reviewed by me and considered in my medical decision making (see chart for details).    MDM Rules/Calculators/A&P                          Patient presents to the emergency department with vaginal discharge that began yesterday.  She is nontoxic, resting comfortably, vitals within normal limits.  She is her pregnancy test is negative.  She has no complaints of pelvic pain and is nontender with abdominal exam as well as bimanual exam, therefore doubt PID.  No urinary symptoms to raise concern for UTI.  Only complaining of discharge.  Wet prep shows moderate WBCs, no trichomoniasis, yeast, or BV.  STD testing obtained including HIV, RPR, GC, and chlamydia.  We discussed option of prophylaxis, given patient states this is not feel similar she would like to hold off on antibiotics which I feel is reasonable.  Will defer treatment to results.  We discussed abstain from intercourse until results are back to avoid potential spreading of infection. I discussed results, treatment plan, need for follow-up, and return precautions with the patient. Provided opportunity for questions, patient confirmed understanding and is in agreement with plan.   Final Clinical Impression(s) / ED Diagnoses Final diagnoses:  Vaginal discharge    Rx / DC Orders ED Discharge Orders    None       Cherly Anderson, PA-C 03/16/20 1944    Bethann Berkshire, MD 03/17/20 1141

## 2020-03-16 NOTE — Discharge Instructions (Addendum)
You were seen in the emergency department today for vaginal discharge.  Your pelvic sample did not show findings of yeast, bacterial vaginosis, or trichomonas.  We have tested you for gonorrhea, chlamydia, HIV, and syphilis, we will call you if any of these results are positive and treat you accordingly.  Do not participate in intercourse of any kind until you have these results as you could be contagious, if all tests are negative you may resume having intercourse, we recommend that you use protection at all times.  If results are positive we will give you more information only call you with the results.  Follow with your primary care provider within 1 week.  Return to the ER for any new or worsening symptoms including but not limited to pelvic pain, fever, vomiting, burning with urination, or any other concerns.

## 2020-03-17 LAB — RPR: RPR Ser Ql: NONREACTIVE

## 2020-03-17 LAB — HIV ANTIBODY (ROUTINE TESTING W REFLEX): HIV Screen 4th Generation wRfx: NONREACTIVE

## 2020-03-18 LAB — GC/CHLAMYDIA PROBE AMP (~~LOC~~) NOT AT ARMC
Chlamydia: NEGATIVE
Comment: NEGATIVE
Comment: NORMAL
Neisseria Gonorrhea: NEGATIVE

## 2020-06-02 DIAGNOSIS — R457 State of emotional shock and stress, unspecified: Secondary | ICD-10-CM | POA: Diagnosis not present

## 2020-06-04 ENCOUNTER — Encounter (HOSPITAL_COMMUNITY): Payer: Self-pay | Admitting: Emergency Medicine

## 2020-06-04 ENCOUNTER — Emergency Department (HOSPITAL_COMMUNITY)
Admission: EM | Admit: 2020-06-04 | Discharge: 2020-06-05 | Disposition: A | Discharge status: Home / Self Care | Payer: Medicaid Other | Attending: Emergency Medicine | Admitting: Emergency Medicine

## 2020-06-04 ENCOUNTER — Other Ambulatory Visit: Payer: Self-pay

## 2020-06-04 DIAGNOSIS — Z87891 Personal history of nicotine dependence: Secondary | ICD-10-CM | POA: Diagnosis not present

## 2020-06-04 DIAGNOSIS — B354 Tinea corporis: Secondary | ICD-10-CM | POA: Diagnosis not present

## 2020-06-04 DIAGNOSIS — R21 Rash and other nonspecific skin eruption: Secondary | ICD-10-CM | POA: Diagnosis present

## 2020-06-04 NOTE — ED Triage Notes (Signed)
Pt states she has a rash on her left breast x 3 days

## 2020-06-05 MED ORDER — MICONAZOLE NITRATE 2 % EX CREA
1.0000 | TOPICAL_CREAM | Freq: Two times a day (BID) | CUTANEOUS | 0 refills | Status: AC
Start: 2020-06-05 — End: ?

## 2020-06-05 NOTE — ED Provider Notes (Signed)
[facility] EMERGENCY DEPARTMENT Provider Note   CSN: [id number] Arrival date & time: 06/04/20  2306   History Chief Complaint  Patient presents with  . Rash    Diana Pennington is a 20 y.o. female.  The history is provided by the patient.  Rash She has history of bipolar disorder, attention deficit disorder and comes in complaining of a rash on her left breast for the last 3 days.  Rash is not painful or pruritic.  She thinks it might be ringworm.  It is not getting any larger.  She denies any unusual contact or exposure.  Past Medical History:  Diagnosis Date  . ADHD   . ADHD (attention deficit hyperactivity disorder)   . Attention deficit hyperactivity disorder (ADHD) 07/21/2015  . Bipolar 1 disorder (HCC)   . Bipolar 1 disorder (HCC)   . Chlamydia   . Gonorrhea   . Insomnia 07/21/2015    Patient Active Problem List   Diagnosis Date Noted  . PID (acute pelvic inflammatory disease) 04/11/2017  . Indication for care in labor or delivery 04/23/2016  . Bipolar affect, depressed (HCC) 07/21/2015  . Attention deficit hyperactivity disorder (ADHD) 07/21/2015  . Insomnia 07/21/2015    Past Surgical History:  Procedure Laterality Date  . CHOLECYSTECTOMY       OB History    Gravida  1   Para  1   Term  1   Preterm      AB      Living  1     SAB      IAB      Ectopic      Multiple  0   Live Births  1           Family History  Problem Relation Age of Onset  . Diabetes Mother   . Hypertension Mother     Social History   Tobacco Use  . Smoking status: Former Smoker    Packs/day: 0.50    Years: 1.00    Pack years: 0.50    Types: Cigarettes  . Smokeless tobacco: Never Used  Vaping Use  . Vaping Use: Every day  . Substances: Nicotine, Flavoring  Substance Use Topics  . Alcohol use: Yes    Comment: occ  . Drug use: Yes    Types: Marijuana    Home Medications Prior to Admission medications   Medication Sig Start Date End Date Taking?  Authorizing Provider  miconazole (MICOTIN) 2 % cream Apply 1 application topically 2 (two) times daily. 06/05/20  Yes Dione Booze, MD  ibuprofen (ADVIL) 200 MG tablet Take 200 mg by mouth every 6 (six) hours as needed.    [provider]  levonorgestrel (MIRENA) 20 MCG/24HR IUD 1 each by Intrauterine route once.    [provider]  benztropine (COGENTIN) 0.5 MG tablet Take 1 tablet (0.5 mg total) by mouth 2 (two) times daily. Patient not taking: No sig reported 07/29/15 03/16/20  [name], MD  pantoprazole (PROTONIX) 40 MG tablet Take 1 tablet (40 mg total) by mouth daily. Patient not taking: Reported on 03/16/2020 09/02/19 03/16/20  Dione Booze, MD    Allergies    Ativan [lorazepam]  Review of Systems   Review of Systems  Skin: Positive for rash.  All other systems reviewed and are negative.   Physical Exam Updated Vital Signs BP (!) 107/57   Pulse (!) 56   Temp 98 F (36.7 C)   Resp 18   Ht 5\' 1"  (  1.549 m)   Wt 65.3 kg   LMP 05/30/2020   SpO2 100%   BMI 27.21 kg/m   Physical Exam Vitals and nursing note reviewed.   20 year old female, resting comfortably and in no acute distress. Vital signs are normal. Oxygen saturation is 100%, which is normal. Head is normocephalic and atraumatic. PERRLA, EOMI. Oropharynx is clear. Neck is nontender and supple without adenopathy or JVD. Back is nontender and there is no CVA tenderness. Lungs are clear without rales, wheezes, or rhonchi. Chest is nontender. Heart has regular rate and rhythm without murmur. Abdomen is soft, flat, nontender without masses or hepatosplenomegaly and peristalsis is normoactive. Extremities have no cyanosis or edema, full range of motion is present. Skin is warm and dry.  There is a 1 cm erythematous lesion on the lateral aspect of the left breast with heaped up margins.  This appears to be typical of tinea corporis. Neurologic: Mental status is normal, cranial nerves are  intact, there are no motor or sensory deficits.   ED Results / Procedures / Treatments    Procedures Procedures   Medications Ordered in ED Medications - No data to display  ED Course  I have reviewed the triage vital signs and the nursing notes.  MDM Rules/Calculators/A&P Tinea corporis.  She is discharged with prescription for miconazole with instructions to apply twice a day until the rash is completely gone, and continue for an additional 2 weeks after that.  Old records are reviewed, and she has no relevant past visits.  Final Clinical Impression(s) / ED Diagnoses Final diagnoses:  Tinea corporis    Rx / DC Orders ED Discharge Orders         Ordered    miconazole (MICOTIN) 2 % cream  2 times daily        06/05/20 0101           Dione Booze, MD 06/05/20 0107

## 2020-06-05 NOTE — Discharge Instructions (Signed)
Apply the antifungal cream twice a day until there is no longer any trace of the infection.  Then, continue applying the antifungal cream twice a day for an additional 2 weeks.  If you stop treatment early, the infection is likely to come back.

## 2020-07-14 ENCOUNTER — Other Ambulatory Visit: Payer: Self-pay

## 2020-07-14 ENCOUNTER — Encounter (HOSPITAL_COMMUNITY): Payer: Self-pay | Admitting: *Deleted

## 2020-07-14 ENCOUNTER — Emergency Department (HOSPITAL_COMMUNITY)
Admission: EM | Admit: 2020-07-14 | Discharge: 2020-07-15 | Disposition: A | Discharge status: Home / Self Care | Payer: Medicaid Other | Attending: Emergency Medicine | Admitting: Emergency Medicine

## 2020-07-14 DIAGNOSIS — N72 Inflammatory disease of cervix uteri: Secondary | ICD-10-CM | POA: Insufficient documentation

## 2020-07-14 DIAGNOSIS — Z87891 Personal history of nicotine dependence: Secondary | ICD-10-CM | POA: Insufficient documentation

## 2020-07-14 DIAGNOSIS — N898 Other specified noninflammatory disorders of vagina: Secondary | ICD-10-CM | POA: Diagnosis present

## 2020-07-14 DIAGNOSIS — R110 Nausea: Secondary | ICD-10-CM | POA: Insufficient documentation

## 2020-07-14 LAB — WET PREP, GENITAL
Sperm: NONE SEEN
Trich, Wet Prep: NONE SEEN
Yeast Wet Prep HPF POC: NONE SEEN

## 2020-07-14 LAB — PREGNANCY, URINE: Preg Test, Ur: NEGATIVE

## 2020-07-14 LAB — URINALYSIS, ROUTINE W REFLEX MICROSCOPIC
Bacteria, UA: NONE SEEN
Bilirubin Urine: NEGATIVE
Glucose, UA: NEGATIVE mg/dL
Ketones, ur: NEGATIVE mg/dL
Nitrite: NEGATIVE
Protein, ur: NEGATIVE mg/dL
Specific Gravity, Urine: 1.019 (ref 1.005–1.030)
pH: 5 (ref 5.0–8.0)

## 2020-07-14 MED ORDER — STERILE WATER FOR INJECTION IJ SOLN
INTRAMUSCULAR | Status: AC
  Administered 2020-07-14: 10 mL
  Filled 2020-07-14: qty 10

## 2020-07-14 MED ORDER — CEFTRIAXONE SODIUM 500 MG IJ SOLR
500.0000 mg | Freq: Once | INTRAMUSCULAR | Status: AC
  Administered 2020-07-14: 500 mg via INTRAMUSCULAR
  Filled 2020-07-14: qty 500

## 2020-07-14 MED ORDER — ONDANSETRON 4 MG PO TBDP
4.0000 mg | ORAL_TABLET | Freq: Once | ORAL | Status: AC
  Administered 2020-07-14: 4 mg via ORAL
  Filled 2020-07-14: qty 1

## 2020-07-14 MED ORDER — HYDROXYZINE HCL 25 MG PO TABS
50.0000 mg | ORAL_TABLET | Freq: Once | ORAL | Status: AC
  Administered 2020-07-14: 50 mg via ORAL
  Filled 2020-07-14: qty 2

## 2020-07-14 NOTE — ED Provider Notes (Signed)
[facility]-EMERGENCY DEPT [facility] Emergency Department Provider Note MRN:  [id number]  Arrival date & time: 07/15/20     Chief Complaint   Vaginal Discharge   History of Present Illness   Diana Pennington is a 20 y.o. year-old female with a history of bipolar disorder, chlamydia, gonorrhea presenting to the ED with chief complaint of vaginal discharge.  Yellow vaginal discharge with lower pelvic discomfort for the past 2 or 3 days.  Also with some mild nausea.  Denies fever, no chest pain or shortness of breath, no current vaginal bleeding.  Symptoms constant, mild to moderate, no exacerbating or alleviating factors.  Review of Systems  A complete 10 system review of systems was obtained and all systems are negative except as noted in the HPI and PMH.   Patient's Health History    Past Medical History:  Diagnosis Date  . ADHD   . ADHD (attention deficit hyperactivity disorder)   . Attention deficit hyperactivity disorder (ADHD) 07/21/2015  . Bipolar 1 disorder (HCC)   . Bipolar 1 disorder (HCC)   . Chlamydia   . Gonorrhea   . Insomnia 07/21/2015    Past Surgical History:  Procedure Laterality Date  . CHOLECYSTECTOMY      Family History  Problem Relation Age of Onset  . Diabetes Mother   . Hypertension Mother     Social History   Socioeconomic History  . Marital status: Single    Spouse name: Not on file  . Number of children: Not on file  . Years of education: Not on file  . Highest education level: Not on file  Occupational History  . Not on file  Tobacco Use  . Smoking status: Former Smoker    Packs/day: 0.50    Years: 1.00    Pack years: 0.50    Types: Cigarettes  . Smokeless tobacco: Never Used  Vaping Use  . Vaping Use: Every day  . Substances: Nicotine, Flavoring  Substance and Sexual Activity  . Alcohol use: Not Currently  . Drug use: Yes    Types: Marijuana  . Sexual activity: Yes    Birth control/protection: I.U.D.  Other Topics Concern  . Not  on file  Social History Narrative  . Not on file   Social Determinants of Health   Financial Resource Strain: Not on file  Food Insecurity: Not on file  Transportation Needs: Not on file  Physical Activity: Not on file  Stress: Not on file  Social Connections: Not on file  Intimate Partner Violence: Not on file     Physical Exam   Vitals:   07/14/20 2225  BP: 109/76  Pulse: 67  Resp: 17  Temp: 98.5 F (36.9 C)  SpO2: 100%    CONSTITUTIONAL: Well-appearing, NAD NEURO:  Alert and oriented x 3, no focal deficits EYES:  eyes equal and reactive ENT/NECK:  no LAD, no JVD CARDIO: Regular rate, well-perfused, normal S1 and S2 PULM:  CTAB no wheezing or rhonchi GI/GU:  normal bowel sounds, non-distended, non-tender MSK/SPINE:  No gross deformities, no edema SKIN:  no rash, atraumatic PSYCH:  Appropriate speech and behavior  *Additional and/or pertinent findings included in MDM below  Diagnostic and Interventional Summary    EKG Interpretation  Date/Time:    Ventricular Rate:    PR Interval:    QRS Duration:   QT Interval:    QTC Calculation:   R Axis:     Text Interpretation:        Labs Reviewed  WET  PREP, GENITAL - Abnormal; Notable for the following components:      Result Value   Clue Cells Wet Prep HPF POC PRESENT (*)    WBC, Wet Prep HPF POC FEW (*)    All other components within normal limits  URINALYSIS, ROUTINE W REFLEX MICROSCOPIC - Abnormal; Notable for the following components:   APPearance HAZY (*)    Hgb urine dipstick LARGE (*)    Leukocytes,Ua SMALL (*)    All other components within normal limits  PREGNANCY, URINE  GC/CHLAMYDIA PROBE AMP ([place]) NOT AT [facility]    No orders to display    Medications  ondansetron (ZOFRAN-ODT) disintegrating tablet 4 mg (4 mg Oral Given 07/14/20 2316)  hydrOXYzine (ATARAX/VISTARIL) tablet 50 mg (50 mg Oral Given 07/14/20 2316)  cefTRIAXone (ROCEPHIN) injection 500 mg (500 mg Intramuscular Given 07/14/20  2344)  sterile water (preservative free) injection (10 mLs  Given 07/14/20 2344)     Procedures  /  Critical Care Procedures  ED Course and Medical Decision Making  I have reviewed the triage vital signs, the nursing notes, and pertinent available records from the EMR.  Listed above are laboratory and imaging tests that I personally ordered, reviewed, and interpreted and then considered in my medical decision making (see below for details).  Well-appearing, normal vital signs, benign abdomen, considering vaginitis, STD, UTI, yeast infection, BV.  Looks like she has a history of PID.  Will perform pelvic exam. Clinical Course as of 07/15/20 0009  Tue Jul 14, 2020  2348 Pelvic exam reveals a tender and erythematous cervix, no adnexal masses, scant yellow-green discharge.  Will treat for PID. [MB]    Clinical Course User Index [MB] Sabas Sous, MD     Wet prep does reveal clue cells as well.  Appropriate for discharge on antibiotics.  Elmer Sow. Pilar Plate, MD [facility] Emergency Medicine [facility] [email or web address]  Final Clinical Impressions(s) / ED Diagnoses     ICD-10-CM   1. Cervicitis  N72     ED Discharge Orders         Ordered    doxycycline (VIBRAMYCIN) 100 MG capsule  2 times daily        07/15/20 0009    metroNIDAZOLE (FLAGYL) 500 MG tablet  2 times daily        07/15/20 0009           Discharge Instructions Discussed with and Provided to Patient:     Discharge Instructions     You were evaluated in the Emergency Department and after careful evaluation, we did not find any emergent condition requiring admission or further testing in the hospital.  Your exam/testing today was overall reassuring.  Symptoms seem to be due to an infection of the cervix.  Please take the Flagyl and doxycycline antibiotics as directed.  Please return to the Emergency Department if you experience any worsening of your condition.  Thank you for allowing [place] to  be a part of your care.        Sabas Sous, MD 07/15/20 Burna [place]imer

## 2020-07-14 NOTE — ED Triage Notes (Signed)
Pt with vaginal discharge and itching for past 2 days.  Pt not aware of any odor.  Also states she has been spotting 2 days ago.

## 2020-07-15 MED ORDER — METRONIDAZOLE 500 MG PO TABS: 500.0000 mg | ORAL_TABLET | Freq: Two times a day (BID) | ORAL | 0 refills | Status: AC

## 2020-07-15 MED ORDER — DOXYCYCLINE HYCLATE 100 MG PO CAPS: 100.0000 mg | ORAL_CAPSULE | Freq: Two times a day (BID) | ORAL | 0 refills | Status: AC

## 2020-07-15 NOTE — Discharge Instructions (Addendum)
You were evaluated in the Emergency Department and after careful evaluation, we did not find any emergent condition requiring admission or further testing in the hospital.  Your exam/testing today was overall reassuring.  Symptoms seem to be due to an infection of the cervix.  Please take the Flagyl and doxycycline antibiotics as directed.  Please return to the Emergency Department if you experience any worsening of your condition.  Thank you for allowing [place] to be a part of your care.

## 2020-07-16 LAB — GC/CHLAMYDIA PROBE AMP (~~LOC~~) NOT AT ARMC
Chlamydia: NEGATIVE
Comment: NEGATIVE
Comment: NORMAL
Neisseria Gonorrhea: NEGATIVE

## 2020-07-18 ENCOUNTER — Emergency Department (HOSPITAL_COMMUNITY)
Admission: EM | Admit: 2020-07-18 | Discharge: 2020-07-18 | Disposition: A | Discharge status: Home / Self Care | Payer: Medicaid Other | Attending: Emergency Medicine | Admitting: Emergency Medicine

## 2020-07-18 ENCOUNTER — Other Ambulatory Visit: Payer: Self-pay

## 2020-07-18 DIAGNOSIS — R112 Nausea with vomiting, unspecified: Secondary | ICD-10-CM

## 2020-07-18 DIAGNOSIS — Z87891 Personal history of nicotine dependence: Secondary | ICD-10-CM | POA: Diagnosis not present

## 2020-07-18 DIAGNOSIS — Z0279 Encounter for issue of other medical certificate: Secondary | ICD-10-CM | POA: Diagnosis not present

## 2020-07-18 DIAGNOSIS — T364X5A Adverse effect of tetracyclines, initial encounter: Secondary | ICD-10-CM | POA: Diagnosis not present

## 2020-07-18 NOTE — ED Provider Notes (Signed)
[facility] EMERGENCY DEPARTMENT Provider Note   CSN: [id number] Arrival date & time: 07/18/20  2103     History Chief Complaint  Patient presents with  . Work Note    Diana Pennington is a 20 y.o. female.  HPI      Diana Pennington is a 20 y.o. female who presents to the Emergency Department requesting return to work note.  She was seen here 3 days ago for a pelvic infection and treated with metronidazole and doxycycline.  She states when she attempted to take the medication on an empty stomach as directed she vomits.  Denies abdominal pain, fever or chills.  States she is able to take the medication if she eats food.  Last vomited around 11 AM this morning and has tolerated solid foods and liquids since then without difficulty.  She is here requesting a note to return to work tomorrow.     Past Medical History:  Diagnosis Date  . ADHD   . ADHD (attention deficit hyperactivity disorder)   . Attention deficit hyperactivity disorder (ADHD) 07/21/2015  . Bipolar 1 disorder (HCC)   . Bipolar 1 disorder (HCC)   . Chlamydia   . Gonorrhea   . Insomnia 07/21/2015    Patient Active Problem List   Diagnosis Date Noted  . PID (acute pelvic inflammatory disease) 04/11/2017  . Indication for care in labor or delivery 04/23/2016  . Bipolar affect, depressed (HCC) 07/21/2015  . Attention deficit hyperactivity disorder (ADHD) 07/21/2015  . Insomnia 07/21/2015    Past Surgical History:  Procedure Laterality Date  . CHOLECYSTECTOMY       OB History    Gravida  1   Para  1   Term  1   Preterm      AB      Living  1     SAB      IAB      Ectopic      Multiple  0   Live Births  1           Family History  Problem Relation Age of Onset  . Diabetes Mother   . Hypertension Mother     Social History   Tobacco Use  . Smoking status: Former Smoker    Packs/day: 0.50    Years: 1.00    Pack years: 0.50    Types: Cigarettes  . Smokeless tobacco: Never Used   Vaping Use  . Vaping Use: Every day  . Substances: Nicotine, Flavoring  Substance Use Topics  . Alcohol use: Not Currently  . Drug use: Yes    Types: Marijuana    Home Medications Prior to Admission medications   Medication Sig Start Date End Date Taking? Authorizing Provider  doxycycline (VIBRAMYCIN) 100 MG capsule Take 1 capsule (100 mg total) by mouth 2 (two) times daily for 7 days. 07/15/20 07/22/20  Sabas Sous, MD  ibuprofen (ADVIL) 200 MG tablet Take 200 mg by mouth every 6 (six) hours as needed.    [provider]  levonorgestrel (MIRENA) 20 MCG/24HR IUD 1 each by Intrauterine route once.    [provider]  metroNIDAZOLE (FLAGYL) 500 MG tablet Take 1 tablet (500 mg total) by mouth 2 (two) times daily for 7 days. 07/15/20 07/22/20  Sabas Sous, MD  miconazole (MICOTIN) 2 % cream Apply 1 application topically 2 (two) times daily. 06/05/20   Dione Booze, MD  benztropine (COGENTIN) 0.5 MG tablet Take 1 tablet (0.5 mg total) by mouth  2 (two) times daily. Patient not taking: No sig reported 07/29/15 03/16/20  [name], MD  pantoprazole (PROTONIX) 40 MG tablet Take 1 tablet (40 mg total) by mouth daily. Patient not taking: Reported on 03/16/2020 09/02/19 03/16/20  Dione Booze, MD    Allergies    Ativan [lorazepam]  Review of Systems   Review of Systems  Constitutional: Negative for chills and fever.  HENT: Negative for sore throat and trouble swallowing.   Respiratory: Negative for shortness of breath.   Cardiovascular: Negative for chest pain.  Gastrointestinal: Positive for nausea and vomiting. Negative for abdominal pain and diarrhea.  Genitourinary: Negative for dysuria.  Musculoskeletal: Negative for arthralgias.  Skin: Negative for rash.  Neurological: Negative for dizziness, weakness, numbness and headaches.  Hematological: Does not bruise/bleed easily.    Physical Exam Updated Vital Signs BP 116/73   Pulse 68   Temp 98 F  (36.7 C)   Resp 18   LMP 07/12/2020   SpO2 99%   Physical Exam Vitals and nursing note reviewed.  Constitutional:      Appearance: Normal appearance. She is not toxic-appearing.  HENT:     Head: Normocephalic.     Mouth/Throat:     Mouth: Mucous membranes are moist.  Neck:     Thyroid: No thyromegaly.     Meningeal: Kernig's sign absent.  Cardiovascular:     Rate and Rhythm: Normal rate and regular rhythm.     Heart sounds: Normal heart sounds.  Pulmonary:     Effort: Pulmonary effort is normal.     Breath sounds: Normal breath sounds. No wheezing.  Abdominal:     Palpations: Abdomen is soft.     Tenderness: There is no abdominal tenderness. There is no guarding or rebound.  Musculoskeletal:        General: Normal range of motion.     Cervical back: Normal range of motion and neck supple.  Skin:    General: Skin is warm.     Capillary Refill: Capillary refill takes less than 2 seconds.     Findings: No rash.  Neurological:     General: No focal deficit present.     Mental Status: She is alert.     Sensory: No sensory deficit.     Motor: No weakness.     ED Results / Procedures / Treatments   Labs (all labs ordered are listed, but only abnormal results are displayed) Labs Reviewed - No data to display  EKG None  Radiology No results found.  Procedures Procedures   Medications Ordered in ED Medications - No data to display  ED Course  I have reviewed the triage vital signs and the nursing notes.  Pertinent labs & imaging results that were available during my care of the patient were reviewed by me and considered in my medical decision making (see chart for details).    MDM Rules/Calculators/A&P                          Patient here requesting a return to work note.  Reported vomiting after taking antibiotics on an empty stomach and was unable to go to work today.  On exam, abdomen is soft without peritoneal signs.  Patient well-appearing.  Vital signs  reassuring.  Doubt emergent process.  Patient reports feeling better and no vomiting for 10 hours and has tolerated foods and liquid.  I feel that she is appropriate for return to work.  Work  note provided.   Final Clinical Impression(s) / ED Diagnoses Final diagnoses:  Non-intractable vomiting with nausea, unspecified vomiting type    Rx / DC Orders ED Discharge Orders    None       Rosey Bath 07/18/20 2131    Jacalyn Lefevre, MD 07/18/20 2201

## 2020-07-18 NOTE — Discharge Instructions (Signed)
Follow-up with your primary care provider for recheck if needed. 

## 2020-07-18 NOTE — ED Triage Notes (Signed)
Pt was placed on a ABX x 2 days ago. Pt states that she took it on an empty stomach which made her throw up and she couldn't go to work. Pt states that she just needs a work note saying she is clear to go back to work tomorrow

## 2020-07-28 ENCOUNTER — Other Ambulatory Visit: Payer: Self-pay

## 2020-07-28 ENCOUNTER — Emergency Department (HOSPITAL_COMMUNITY)
Admission: EM | Admit: 2020-07-28 | Discharge: 2020-07-28 | Discharge status: Against Medical Advice | Payer: Medicaid Other

## 2020-07-29 ENCOUNTER — Other Ambulatory Visit: Payer: Self-pay

## 2020-07-29 ENCOUNTER — Encounter (HOSPITAL_COMMUNITY): Payer: Self-pay

## 2020-07-29 DIAGNOSIS — N898 Other specified noninflammatory disorders of vagina: Secondary | ICD-10-CM | POA: Insufficient documentation

## 2020-07-29 DIAGNOSIS — Z87891 Personal history of nicotine dependence: Secondary | ICD-10-CM | POA: Diagnosis not present

## 2020-07-29 NOTE — ED Triage Notes (Signed)
Pt to er, pt stats that for the past two days she has been having vaginal pain and discharge, pt states that she thinks that she has a yeast infection, states that she was having some itching, but that is better now.

## 2020-07-30 ENCOUNTER — Emergency Department (HOSPITAL_COMMUNITY)
Admission: EM | Admit: 2020-07-30 | Discharge: 2020-07-30 | Disposition: A | Discharge status: Home / Self Care | Payer: Medicaid Other | Attending: Emergency Medicine | Admitting: Emergency Medicine

## 2020-07-30 DIAGNOSIS — N898 Other specified noninflammatory disorders of vagina: Secondary | ICD-10-CM

## 2020-07-30 LAB — RPR: RPR Ser Ql: NONREACTIVE

## 2020-07-30 LAB — WET PREP, GENITAL
Clue Cells Wet Prep HPF POC: NONE SEEN
Sperm: NONE SEEN
Trich, Wet Prep: NONE SEEN
Yeast Wet Prep HPF POC: NONE SEEN

## 2020-07-30 LAB — URINALYSIS, ROUTINE W REFLEX MICROSCOPIC
Bilirubin Urine: NEGATIVE
Glucose, UA: NEGATIVE mg/dL
Hgb urine dipstick: NEGATIVE
Ketones, ur: NEGATIVE mg/dL
Leukocytes,Ua: NEGATIVE
Nitrite: NEGATIVE
Protein, ur: NEGATIVE mg/dL
Specific Gravity, Urine: 1.019 (ref 1.005–1.030)
pH: 6 (ref 5.0–8.0)

## 2020-07-30 LAB — HIV ANTIBODY (ROUTINE TESTING W REFLEX): HIV Screen 4th Generation wRfx: NONREACTIVE

## 2020-07-30 MED ORDER — DOXYCYCLINE HYCLATE 100 MG PO CAPS: 100.0000 mg | ORAL_CAPSULE | Freq: Two times a day (BID) | ORAL | 0 refills | Status: DC

## 2020-07-30 MED ORDER — DOXYCYCLINE HYCLATE 100 MG PO TABS
100.0000 mg | ORAL_TABLET | Freq: Once | ORAL | Status: AC
  Administered 2020-07-30: 100 mg via ORAL
  Filled 2020-07-30: qty 1

## 2020-07-30 MED ORDER — METRONIDAZOLE 500 MG PO TABS
500.0000 mg | ORAL_TABLET | Freq: Once | ORAL | Status: AC
  Administered 2020-07-30: 500 mg via ORAL
  Filled 2020-07-30: qty 1

## 2020-07-30 MED ORDER — METRONIDAZOLE 500 MG PO TABS: 500.0000 mg | ORAL_TABLET | Freq: Two times a day (BID) | ORAL | 0 refills | Status: DC

## 2020-07-30 MED ORDER — CEFTRIAXONE SODIUM 500 MG IJ SOLR
500.0000 mg | Freq: Once | INTRAMUSCULAR | Status: AC
  Administered 2020-07-30: 500 mg via INTRAMUSCULAR
  Filled 2020-07-30: qty 500

## 2020-07-30 MED ORDER — STERILE WATER FOR INJECTION IJ SOLN
INTRAMUSCULAR | Status: AC
  Filled 2020-07-30: qty 10

## 2020-07-30 NOTE — ED Provider Notes (Signed)
[facility] EMERGENCY DEPARTMENT Provider Note   CSN: [id number] Arrival date & time: 07/29/20  2159     History Chief Complaint  Patient presents with  . Vaginal Itching    Diana Pennington is a 20 y.o. female.   Vaginal Itching This is a recurrent problem. The current episode started 2 days ago. The problem occurs constantly. The problem has not changed since onset.Pertinent negatives include no chest pain, no abdominal pain, no headaches and no shortness of breath. Nothing aggravates the symptoms. She has tried nothing for the symptoms. The treatment provided no relief.       Past Medical History:  Diagnosis Date  . ADHD   . ADHD (attention deficit hyperactivity disorder)   . Attention deficit hyperactivity disorder (ADHD) 07/21/2015  . Bipolar 1 disorder (HCC)   . Bipolar 1 disorder (HCC)   . Chlamydia   . Gonorrhea   . Insomnia 07/21/2015    Patient Active Problem List   Diagnosis Date Noted  . PID (acute pelvic inflammatory disease) 04/11/2017  . Indication for care in labor or delivery 04/23/2016  . Bipolar affect, depressed (HCC) 07/21/2015  . Attention deficit hyperactivity disorder (ADHD) 07/21/2015  . Insomnia 07/21/2015    Past Surgical History:  Procedure Laterality Date  . CHOLECYSTECTOMY       OB History    Gravida  1   Para  1   Term  1   Preterm      AB      Living  1     SAB      IAB      Ectopic      Multiple  0   Live Births  1           Family History  Problem Relation Age of Onset  . Diabetes Mother   . Hypertension Mother     Social History   Tobacco Use  . Smoking status: Former Smoker    Packs/day: 0.50    Years: 1.00    Pack years: 0.50    Types: Cigarettes  . Smokeless tobacco: Never Used  Vaping Use  . Vaping Use: Former  . Substances: Nicotine, Flavoring  Substance Use Topics  . Alcohol use: Not Currently  . Drug use: Yes    Types: Marijuana    Home Medications Prior to Admission medications    Medication Sig Start Date End Date Taking? Authorizing Provider  doxycycline (VIBRAMYCIN) 100 MG capsule Take 1 capsule (100 mg total) by mouth 2 (two) times daily. One po bid x 7 days 07/30/20  Yes Tasheema Perrone, Barbara Cower, MD  metroNIDAZOLE (FLAGYL) 500 MG tablet Take 1 tablet (500 mg total) by mouth 2 (two) times daily. One po bid x 7 days 07/30/20  Yes Hinata Diener, Barbara Cower, MD  ibuprofen (ADVIL) 200 MG tablet Take 200 mg by mouth every 6 (six) hours as needed.    [provider]  levonorgestrel (MIRENA) 20 MCG/24HR IUD 1 each by Intrauterine route once.    [provider]  miconazole (MICOTIN) 2 % cream Apply 1 application topically 2 (two) times daily. 06/05/20   Dione Booze, MD  benztropine (COGENTIN) 0.5 MG tablet Take 1 tablet (0.5 mg total) by mouth 2 (two) times daily. Patient not taking: No sig reported 07/29/15 03/16/20  [name], MD  pantoprazole (PROTONIX) 40 MG tablet Take 1 tablet (40 mg total) by mouth daily. Patient not taking: Reported on 03/16/2020 09/02/19 03/16/20  Dione Booze, MD    Allergies  Ativan [lorazepam]  Review of Systems   Review of Systems  Respiratory: Negative for shortness of breath.   Cardiovascular: Negative for chest pain.  Gastrointestinal: Negative for abdominal pain.  Neurological: Negative for headaches.  All other systems reviewed and are negative.   Physical Exam Updated Vital Signs BP 119/77   Pulse 65   Temp 98.7 F (37.1 C) (Oral)   Resp 18   Ht 5\' 1"  (1.549 m)   Wt 65.3 kg   LMP 07/12/2020   SpO2 100%   BMI 27.21 kg/m   Physical Exam Vitals and nursing note reviewed. Exam conducted with a chaperone present.  Constitutional:      Appearance: She is well-developed.  HENT:     Head: Normocephalic and atraumatic.     Mouth/Throat:     Mouth: Mucous membranes are moist.     Pharynx: Oropharynx is clear.  Eyes:     Pupils: Pupils are equal, round, and reactive to light.  Cardiovascular:     Rate and Rhythm:  Normal rate and regular rhythm.  Pulmonary:     Effort: No respiratory distress.     Breath sounds: No stridor.  Abdominal:     General: Abdomen is flat. There is no distension.  Genitourinary:    Cervix: Friability present. No cervical motion tenderness or lesion.     Uterus: Normal.      Adnexa: Right adnexa normal.       Left: Tenderness present. No mass or fullness.       Comments: Chaperoned by nurse, 07/14/2020 Musculoskeletal:        General: Normal range of motion.     Cervical back: Normal range of motion.  Skin:    General: Skin is warm and dry.  Neurological:     General: No focal deficit present.     Mental Status: She is alert.     ED Results / Procedures / Treatments   Labs (all labs ordered are listed, but only abnormal results are displayed) Labs Reviewed  WET PREP, GENITAL - Abnormal; Notable for the following components:      Result Value   WBC, Wet Prep HPF POC FEW (*)    All other components within normal limits  URINALYSIS, ROUTINE W REFLEX MICROSCOPIC - Abnormal; Notable for the following components:   APPearance HAZY (*)    All other components within normal limits  RPR  HIV ANTIBODY (ROUTINE TESTING W REFLEX)  GC/CHLAMYDIA PROBE AMP ([place]) NOT AT [facility]    EKG None  Radiology No results found.  Procedures Procedures   Medications Ordered in ED Medications  sterile water (preservative free) injection (has no administration in time range)  cefTRIAXone (ROCEPHIN) injection 500 mg (500 mg Intramuscular Given 07/30/20 0417)  doxycycline (VIBRA-TABS) tablet 100 mg (100 mg Oral Given 07/30/20 0418)  metroNIDAZOLE (FLAGYL) tablet 500 mg (500 mg Oral Given 07/30/20 0418)    ED Course  I have reviewed the triage vital signs and the nursing notes.  Pertinent labs & imaging results that were available during my care of the patient were reviewed by me and considered in my medical decision making (see chart for details).    MDM  Rules/Calculators/A&P                          Possibly STD with possible PID. Will treat for same. No e/o yeast infection.   Final Clinical Impression(s) / ED Diagnoses Final diagnoses:  Vaginal discharge  Rx / DC Orders ED Discharge Orders         Ordered    doxycycline (VIBRAMYCIN) 100 MG capsule  2 times daily        07/30/20 0354    metroNIDAZOLE (FLAGYL) 500 MG tablet  2 times daily        07/30/20 0354           Quorra Rosene, Barbara Cower, MD 07/30/20 [phone]

## 2020-07-31 LAB — GC/CHLAMYDIA PROBE AMP (~~LOC~~) NOT AT ARMC
Chlamydia: NEGATIVE
Comment: NEGATIVE
Comment: NORMAL
Neisseria Gonorrhea: NEGATIVE

## 2020-08-07 DIAGNOSIS — Z0000 Encounter for general adult medical examination without abnormal findings: Secondary | ICD-10-CM | POA: Diagnosis not present

## 2020-08-07 DIAGNOSIS — E663 Overweight: Secondary | ICD-10-CM | POA: Diagnosis not present

## 2020-08-07 DIAGNOSIS — R102 Pelvic and perineal pain: Secondary | ICD-10-CM | POA: Diagnosis not present

## 2020-08-07 DIAGNOSIS — F419 Anxiety disorder, unspecified: Secondary | ICD-10-CM | POA: Diagnosis not present

## 2020-08-07 DIAGNOSIS — Z6826 Body mass index (BMI) 26.0-26.9, adult: Secondary | ICD-10-CM | POA: Diagnosis not present

## 2020-10-27 ENCOUNTER — Encounter: Payer: Self-pay | Admitting: Emergency Medicine

## 2020-10-27 ENCOUNTER — Other Ambulatory Visit: Payer: Self-pay

## 2020-10-27 ENCOUNTER — Ambulatory Visit
Admission: EM | Admit: 2020-10-27 | Discharge: 2020-10-27 | Disposition: A | Discharge status: Home / Self Care | Payer: Medicaid Other | Attending: Family Medicine | Admitting: Family Medicine

## 2020-10-27 DIAGNOSIS — N3001 Acute cystitis with hematuria: Secondary | ICD-10-CM | POA: Insufficient documentation

## 2020-10-27 DIAGNOSIS — Z113 Encounter for screening for infections with a predominantly sexual mode of transmission: Secondary | ICD-10-CM | POA: Insufficient documentation

## 2020-10-27 LAB — POCT URINALYSIS DIP (MANUAL ENTRY)
Bilirubin, UA: NEGATIVE
Glucose, UA: NEGATIVE mg/dL
Ketones, POC UA: NEGATIVE mg/dL
Nitrite, UA: NEGATIVE
Protein Ur, POC: NEGATIVE mg/dL
Spec Grav, UA: 1.02 (ref 1.010–1.025)
Urobilinogen, UA: 0.2 E.U./dL
pH, UA: 5.5 (ref 5.0–8.0)

## 2020-10-27 LAB — POCT URINE PREGNANCY: Preg Test, Ur: NEGATIVE

## 2020-10-27 MED ORDER — DOXYCYCLINE HYCLATE 100 MG PO CAPS: 100.0000 mg | ORAL_CAPSULE | Freq: Two times a day (BID) | ORAL | 0 refills | Status: DC

## 2020-10-27 MED ORDER — ONDANSETRON HCL 4 MG PO TABS: 4.0000 mg | ORAL_TABLET | Freq: Four times a day (QID) | ORAL | 0 refills | Status: AC

## 2020-10-27 MED ORDER — IBUPROFEN 800 MG PO TABS: 800.0000 mg | ORAL_TABLET | Freq: Three times a day (TID) | ORAL | 0 refills | Status: AC

## 2020-10-27 NOTE — ED Provider Notes (Signed)
[facility]    CSN: [id number] Arrival date & time: 10/27/20  1733      History   Chief Complaint No chief complaint on file.   HPI Diana Pennington is a 20 y.o. female.   HPI Patient in for STI testing with symptoms of lower abdominal pain and dysuria x 1 days. Reports history of recurrent UTI.  Recent new sexual partner.  No known exposure to any STI.  She currently has an implant therefore is not concerned regarding pregnancy.  Would like HIV and RPR testing.  Reports lower abdominal pain started in conjunction with dysuria x1 day ago.  Denies fever, nausea vomiting diarrhea.  Past Medical History:  Diagnosis Date   ADHD    ADHD (attention deficit hyperactivity disorder)    Attention deficit hyperactivity disorder (ADHD) 07/21/2015   Bipolar 1 disorder (HCC)    Bipolar 1 disorder (HCC)    Chlamydia    Gonorrhea    Insomnia 07/21/2015    Patient Active Problem List   Diagnosis Date Noted   PID (acute pelvic inflammatory disease) 04/11/2017   Indication for care in labor or delivery 04/23/2016   Bipolar affect, depressed (HCC) 07/21/2015   Attention deficit hyperactivity disorder (ADHD) 07/21/2015   Insomnia 07/21/2015    Past Surgical History:  Procedure Laterality Date   CHOLECYSTECTOMY      OB History     Gravida  1   Para  1   Term  1   Preterm      AB      Living  1      SAB      IAB      Ectopic      Multiple  0   Live Births  1            Home Medications    Prior to Admission medications   Medication Sig Start Date End Date Taking? Authorizing Provider  ibuprofen (ADVIL) 800 MG tablet Take 1 tablet (800 mg total) by mouth 3 (three) times daily. 10/27/20  Yes Bing Neighbors, FNP  doxycycline (VIBRAMYCIN) 100 MG capsule Take 1 capsule (100 mg total) by mouth 2 (two) times daily. One po bid x 7 days 10/27/20   Bing Neighbors, FNP  levonorgestrel (MIRENA) 20 MCG/24HR IUD 1 each by Intrauterine route once.    [provider]  metroNIDAZOLE (FLAGYL) 500 MG tablet Take 1 tablet (500 mg total) by mouth 2 (two) times daily. One po bid x 7 days 07/30/20   Mesner, Barbara Cower, MD  miconazole (MICOTIN) 2 % cream Apply 1 application topically 2 (two) times daily. 06/05/20   Dione Booze, MD  ondansetron (ZOFRAN) 4 MG tablet Take 1 tablet (4 mg total) by mouth every 6 (six) hours. 10/27/20  Yes Bing Neighbors, FNP  benztropine (COGENTIN) 0.5 MG tablet Take 1 tablet (0.5 mg total) by mouth 2 (two) times daily. Patient not taking: No sig reported 07/29/15 03/16/20  [name], MD  pantoprazole (PROTONIX) 40 MG tablet Take 1 tablet (40 mg total) by mouth daily. Patient not taking: Reported on 03/16/2020 09/02/19 03/16/20  Dione Booze, MD    Family History Family History  Problem Relation Age of Onset   Diabetes Mother    Hypertension Mother     Social History Social History   Tobacco Use   Smoking status: Former    Packs/day: 0.50    Years: 1.00    Pack years: 0.50    Types:  Cigarettes   Smokeless tobacco: Never  Vaping Use   Vaping Use: Former   Substances: Nicotine, Flavoring  Substance Use Topics   Alcohol use: Not Currently   Drug use: Yes    Types: Marijuana     Allergies   Ativan [lorazepam]   Review of Systems Review of Systems Pertinent negatives listed in HPI   Physical Exam Triage Vital Signs ED Triage Vitals  Enc Vitals Group     BP 10/27/20 1740 114/70     Pulse Rate 10/27/20 1740 66     Resp 10/27/20 1740 16     Temp 10/27/20 1740 99.3 F (37.4 C)     Temp Source 10/27/20 1740 Oral     SpO2 10/27/20 1740 98 %     Weight --      Height --      Head Circumference --      Peak Flow --      Pain Score 10/27/20 1742 7     Pain Loc --      Pain Edu? --      Excl. in GC? --    No data found.  Updated Vital Signs BP 114/70 (BP Location: Right Arm)   Pulse 66   Temp 99.3 F (37.4 C) (Oral)   Resp 16   LMP 10/22/2020 (Exact Date)   SpO2 98%    Visual Acuity Right Eye Distance:   Left Eye Distance:   Bilateral Distance:    Right Eye Near:   Left Eye Near:    Bilateral Near:     Physical Exam  General appearance: alert, well developed, well nourished, cooperative  Head: Normocephalic, without obvious abnormality, atraumatic Respiratory: Respirations even and unlabored, normal respiratory rate Heart: rate and rhythm normal.  Abdomen: BS +, no distention, no rebound tenderness, no flank pain Extremities: No gross deformities Skin: Skin color, texture, turgor normal. No rashes seen  Psych: Appropriate mood and affect. Neurologic: GCS 15, normal coordination normal gait UC Treatments / Results  Labs (all labs ordered are listed, but only abnormal results are displayed) Labs Reviewed  POCT URINALYSIS DIP (MANUAL ENTRY) - Abnormal; Notable for the following components:      Result Value   Clarity, UA cloudy (*)    Blood, UA large (*)    Leukocytes, UA Small (1+) (*)    All other components within normal limits  URINE CULTURE  HIV ANTIBODY (ROUTINE TESTING W REFLEX)  RPR  POCT URINE PREGNANCY  CERVICOVAGINAL ANCILLARY ONLY    EKG   Radiology No results found.  Procedures Procedures (including critical care time)  Medications Ordered in UC Medications - No data to display  Initial Impression / Assessment and Plan / UC Course  I have reviewed the triage vital signs and the nursing notes.  Pertinent labs & imaging results that were available during my care of the patient were reviewed by me and considered in my medical decision making (see chart for details).      UA abnormal and findings consistent with UTI. Empiric antibiotic treatment initiated. Encouraged increase intake of water. Recommended cranberry tablets, wiping from front to back. Urine culture pending.  ER if symptoms become severe. Follow-up with PCP if symptoms do not completely resolve.  Final Clinical Impressions(s) / UC Diagnoses    Final diagnoses:  Routine screening for STI (sexually transmitted infection)  Acute cystitis with hematuria   Discharge Instructions   None    ED Prescriptions     Medication Sig Dispense  Auth. Provider   doxycycline (VIBRAMYCIN) 100 MG capsule  (Status: Discontinued) Take 1 capsule (100 mg total) by mouth 2 (two) times daily. One po bid x 7 days 14 capsule Bing Neighbors, FNP   ondansetron (ZOFRAN) 4 MG tablet Take 1 tablet (4 mg total) by mouth every 6 (six) hours. 12 tablet Bing Neighbors, FNP   ibuprofen (ADVIL) 800 MG tablet Take 1 tablet (800 mg total) by mouth 3 (three) times daily. 21 tablet Bing Neighbors, FNP   doxycycline (VIBRAMYCIN) 100 MG capsule Take 1 capsule (100 mg total) by mouth 2 (two) times daily. One po bid x 7 days 14 capsule Bing Neighbors, FNP      PDMP not reviewed this encounter.   Bing Neighbors, FNP 10/27/20 [phone]

## 2020-10-27 NOTE — ED Triage Notes (Signed)
Wants and STD check.  Lower abd pain since yesterday.  Pain on urination x 1 day.

## 2020-10-28 LAB — CERVICOVAGINAL ANCILLARY ONLY
Bacterial Vaginitis (gardnerella): POSITIVE — AB
Candida Glabrata: NEGATIVE
Candida Vaginitis: POSITIVE — AB
Chlamydia: NEGATIVE
Comment: NEGATIVE
Comment: NEGATIVE
Comment: NEGATIVE
Comment: NEGATIVE
Comment: NEGATIVE
Comment: NORMAL
Neisseria Gonorrhea: NEGATIVE
Trichomonas: NEGATIVE

## 2020-10-28 LAB — HIV ANTIBODY (ROUTINE TESTING W REFLEX): HIV Screen 4th Generation wRfx: NONREACTIVE

## 2020-10-28 LAB — RPR: RPR Ser Ql: NONREACTIVE

## 2020-10-29 ENCOUNTER — Telehealth (HOSPITAL_COMMUNITY): Payer: Self-pay | Admitting: Emergency Medicine

## 2020-10-29 MED ORDER — METRONIDAZOLE 500 MG PO TABS: 500.0000 mg | ORAL_TABLET | Freq: Two times a day (BID) | ORAL | 0 refills | Status: AC

## 2020-10-29 MED ORDER — FLUCONAZOLE 150 MG PO TABS: 150.0000 mg | ORAL_TABLET | Freq: Once | ORAL | 0 refills | Status: AC

## 2020-10-30 LAB — URINE CULTURE: Culture: >=100000 — AB

## 2020-11-23 ENCOUNTER — Other Ambulatory Visit: Payer: Self-pay

## 2020-11-23 ENCOUNTER — Encounter (HOSPITAL_COMMUNITY): Payer: Self-pay | Admitting: Emergency Medicine

## 2020-11-23 ENCOUNTER — Emergency Department (HOSPITAL_COMMUNITY)
Admission: EM | Admit: 2020-11-23 | Discharge: 2020-11-23 | Disposition: A | Discharge status: Home / Self Care | Payer: Medicaid Other | Attending: Emergency Medicine | Admitting: Emergency Medicine

## 2020-11-23 DIAGNOSIS — Z5321 Procedure and treatment not carried out due to patient leaving prior to being seen by health care provider: Secondary | ICD-10-CM | POA: Diagnosis not present

## 2020-11-23 DIAGNOSIS — R1030 Lower abdominal pain, unspecified: Secondary | ICD-10-CM | POA: Insufficient documentation

## 2020-11-23 NOTE — ED Triage Notes (Signed)
Pt c/o lower abd pain. Pt states she was seen recently at [facility] and dx with yeast infection. Pt given abx with no improvement.

## 2020-11-27 ENCOUNTER — Other Ambulatory Visit: Payer: Self-pay

## 2020-11-27 ENCOUNTER — Encounter (HOSPITAL_COMMUNITY): Payer: Self-pay

## 2020-11-27 ENCOUNTER — Emergency Department (HOSPITAL_COMMUNITY): Payer: Medicaid Other

## 2020-11-27 ENCOUNTER — Emergency Department (HOSPITAL_COMMUNITY)
Admission: EM | Admit: 2020-11-27 | Discharge: 2020-11-27 | Disposition: A | Discharge status: Home / Self Care | Payer: Medicaid Other | Attending: Emergency Medicine | Admitting: Emergency Medicine

## 2020-11-27 DIAGNOSIS — Z87891 Personal history of nicotine dependence: Secondary | ICD-10-CM | POA: Diagnosis not present

## 2020-11-27 DIAGNOSIS — R110 Nausea: Secondary | ICD-10-CM | POA: Diagnosis not present

## 2020-11-27 DIAGNOSIS — R519 Headache, unspecified: Secondary | ICD-10-CM | POA: Diagnosis not present

## 2020-11-27 DIAGNOSIS — R102 Pelvic and perineal pain: Secondary | ICD-10-CM | POA: Insufficient documentation

## 2020-11-27 DIAGNOSIS — R0600 Dyspnea, unspecified: Secondary | ICD-10-CM | POA: Insufficient documentation

## 2020-11-27 DIAGNOSIS — N3000 Acute cystitis without hematuria: Secondary | ICD-10-CM | POA: Diagnosis not present

## 2020-11-27 DIAGNOSIS — R0789 Other chest pain: Secondary | ICD-10-CM | POA: Diagnosis not present

## 2020-11-27 DIAGNOSIS — L02211 Cutaneous abscess of abdominal wall: Secondary | ICD-10-CM | POA: Diagnosis not present

## 2020-11-27 DIAGNOSIS — H53149 Visual discomfort, unspecified: Secondary | ICD-10-CM | POA: Insufficient documentation

## 2020-11-27 LAB — CBC WITH DIFFERENTIAL/PLATELET
Abs Immature Granulocytes: 0.03 10*3/uL (ref 0.00–0.07)
Basophils Absolute: 0.1 10*3/uL (ref 0.0–0.1)
Basophils Relative: 1 %
Eosinophils Absolute: 0.4 10*3/uL (ref 0.0–0.5)
Eosinophils Relative: 3 %
HCT: 45.2 % (ref 36.0–46.0)
Hemoglobin: 14.9 g/dL (ref 12.0–15.0)
Immature Granulocytes: 0 %
Lymphocytes Relative: 41 %
Lymphs Abs: 5.3 10*3/uL — ABNORMAL HIGH (ref 0.7–4.0)
MCH: 33.1 pg (ref 26.0–34.0)
MCHC: 33 g/dL (ref 30.0–36.0)
MCV: 100.4 fL — ABNORMAL HIGH (ref 80.0–100.0)
Monocytes Absolute: 0.9 10*3/uL (ref 0.1–1.0)
Monocytes Relative: 7 %
Neutro Abs: 6.4 10*3/uL (ref 1.7–7.7)
Neutrophils Relative %: 48 %
Platelets: 257 10*3/uL (ref 150–400)
RBC: 4.5 MIL/uL (ref 3.87–5.11)
RDW: 11.9 % (ref 11.5–15.5)
WBC: 13.1 10*3/uL — ABNORMAL HIGH (ref 4.0–10.5)
nRBC: 0 % (ref 0.0–0.2)

## 2020-11-27 LAB — URINALYSIS, ROUTINE W REFLEX MICROSCOPIC
Bilirubin Urine: NEGATIVE
Glucose, UA: NEGATIVE mg/dL
Ketones, ur: NEGATIVE mg/dL
Nitrite: POSITIVE — AB
Protein, ur: NEGATIVE mg/dL
Specific Gravity, Urine: 1.025 (ref 1.005–1.030)
pH: 6 (ref 5.0–8.0)

## 2020-11-27 LAB — URINALYSIS, MICROSCOPIC (REFLEX)

## 2020-11-27 LAB — WET PREP, GENITAL
Clue Cells Wet Prep HPF POC: NONE SEEN
Trich, Wet Prep: NONE SEEN
Yeast Wet Prep HPF POC: NONE SEEN

## 2020-11-27 LAB — COMPREHENSIVE METABOLIC PANEL
ALT: 10 U/L (ref 0–44)
AST: 20 U/L (ref 15–41)
Albumin: 4.5 g/dL (ref 3.5–5.0)
Alkaline Phosphatase: 49 U/L (ref 38–126)
Anion gap: 10 (ref 5–15)
BUN: 8 mg/dL (ref 6–20)
CO2: 24 mmol/L (ref 22–32)
Calcium: 9.3 mg/dL (ref 8.9–10.3)
Chloride: 107 mmol/L (ref 98–111)
Creatinine, Ser: 0.77 mg/dL (ref 0.44–1.00)
GFR, Estimated: >60 mL/min (ref >60)
Glucose, Bld: 75 mg/dL (ref 70–99)
Potassium: 4.3 mmol/L (ref 3.5–5.1)
Sodium: 141 mmol/L (ref 135–145)
Total Bilirubin: 0.8 mg/dL (ref 0.3–1.2)
Total Protein: 7.4 g/dL (ref 6.5–8.1)

## 2020-11-27 LAB — LIPASE, BLOOD: Lipase: 47 U/L (ref 11–51)

## 2020-11-27 LAB — PREGNANCY, URINE: Preg Test, Ur: NEGATIVE

## 2020-11-27 MED ORDER — CEPHALEXIN 500 MG PO CAPS
500.0000 mg | ORAL_CAPSULE | Freq: Once | ORAL | Status: AC
  Administered 2020-11-27: 500 mg via ORAL
  Filled 2020-11-27: qty 1

## 2020-11-27 MED ORDER — CEPHALEXIN 500 MG PO CAPS: 500.0000 mg | ORAL_CAPSULE | Freq: Three times a day (TID) | ORAL | 0 refills | Status: AC

## 2020-11-27 MED ORDER — SODIUM CHLORIDE 0.9 % IV BOLUS
1000.0000 mL | Freq: Once | INTRAVENOUS | Status: AC
  Administered 2020-11-27: 1000 mL via INTRAVENOUS

## 2020-11-27 MED ORDER — IOHEXOL 350 MG/ML SOLN
80.0000 mL | Freq: Once | INTRAVENOUS | Status: AC | PRN
  Administered 2020-11-27: 80 mL via INTRAVENOUS

## 2020-11-27 NOTE — ED Triage Notes (Signed)
Pt c/o dizziness and pelvic/vaginal pain x 2 weeks. Hx of recurrent yeast infections. Denies vaginal discharge, STD exposures.

## 2020-11-27 NOTE — ED Notes (Signed)
Patient transported to CT 

## 2020-11-27 NOTE — Discharge Instructions (Addendum)
Begin taking Keflex as prescribed.  Follow-up with primary doctor if symptoms are not improving in the next week.

## 2020-11-27 NOTE — ED Provider Notes (Signed)
[facility] EMERGENCY DEPARTMENT Provider Note   CSN: [id number] Arrival date & time: 11/27/20  4970     History No chief complaint on file.   Diana Pennington is a 20 y.o. female.  Patient is a 20 year old female with past medical history of bipolar disorder, ADHD, previous STDs.  Patient presenting today for evaluation of abdominal and pelvic pain.  This has been present for several weeks.  She was apparently diagnosed with a yeast infection 2 weeks ago and treated, but pain persists.  She also describes multiple other issues such as occasional dyspnea, occasional chest discomfort, blurry vision, feeling nauseated, headaches.  The history is provided by the patient.      Past Medical History:  Diagnosis Date   ADHD    ADHD (attention deficit hyperactivity disorder)    Attention deficit hyperactivity disorder (ADHD) 07/21/2015   Bipolar 1 disorder (HCC)    Bipolar 1 disorder (HCC)    Chlamydia    Gonorrhea    Insomnia 07/21/2015    Patient Active Problem List   Diagnosis Date Noted   PID (acute pelvic inflammatory disease) 04/11/2017   Indication for care in labor or delivery 04/23/2016   Bipolar affect, depressed (HCC) 07/21/2015   Attention deficit hyperactivity disorder (ADHD) 07/21/2015   Insomnia 07/21/2015    Past Surgical History:  Procedure Laterality Date   CHOLECYSTECTOMY       OB History     Gravida  1   Para  1   Term  1   Preterm      AB      Living  1      SAB      IAB      Ectopic      Multiple  0   Live Births  1           Family History  Problem Relation Age of Onset   Diabetes Mother    Hypertension Mother     Social History   Tobacco Use   Smoking status: Former    Packs/day: 0.50    Years: 1.00    Pack years: 0.50    Types: Cigarettes   Smokeless tobacco: Never  Vaping Use   Vaping Use: Former   Substances: Nicotine, Flavoring  Substance Use Topics   Alcohol use: Not Currently   Drug use: Yes    Types:  Marijuana    Home Medications Prior to Admission medications   Medication Sig Start Date End Date Taking? Authorizing Provider  doxycycline (VIBRAMYCIN) 100 MG capsule Take 1 capsule (100 mg total) by mouth 2 (two) times daily. One po bid x 7 days 10/27/20   Bing Neighbors, FNP  ibuprofen (ADVIL) 800 MG tablet Take 1 tablet (800 mg total) by mouth 3 (three) times daily. 10/27/20   Bing Neighbors, FNP  levonorgestrel (MIRENA) 20 MCG/24HR IUD 1 each by Intrauterine route once.    [provider]  metroNIDAZOLE (FLAGYL) 500 MG tablet Take 1 tablet (500 mg total) by mouth 2 (two) times daily. 10/29/20   Lamptey, Britta Mccreedy, MD  miconazole (MICOTIN) 2 % cream Apply 1 application topically 2 (two) times daily. 06/05/20   Dione Booze, MD  ondansetron (ZOFRAN) 4 MG tablet Take 1 tablet (4 mg total) by mouth every 6 (six) hours. 10/27/20   Bing Neighbors, FNP  benztropine (COGENTIN) 0.5 MG tablet Take 1 tablet (0.5 mg total) by mouth 2 (two) times daily. Patient not taking: No sig reported 07/29/15 03/16/20  Amada Kingfisher, Pieter Partridge, MD  pantoprazole (PROTONIX) 40 MG tablet Take 1 tablet (40 mg total) by mouth daily. Patient not taking: Reported on 03/16/2020 09/02/19 03/16/20  Dione Booze, MD    Allergies    Ativan [lorazepam]  Review of Systems   Review of Systems  All other systems reviewed and are negative.  Physical Exam Updated Vital Signs BP 104/63   Pulse (!) 52   Temp 97.7 F (36.5 C)   Resp 17   Ht 5\' 1"  (1.549 m)   Wt 65.3 kg   LMP 11/19/2020 (Exact Date)   SpO2 100%   BMI 27.21 kg/m   Physical Exam Vitals and nursing note reviewed.  Constitutional:      General: She is not in acute distress.    Appearance: She is well-developed. She is not diaphoretic.  HENT:     Head: Normocephalic and atraumatic.  Cardiovascular:     Rate and Rhythm: Normal rate and regular rhythm.     Heart sounds: No murmur heard.   No friction rub. No gallop.  Pulmonary:      Effort: Pulmonary effort is normal. No respiratory distress.     Breath sounds: Normal breath sounds. No wheezing.  Abdominal:     General: Bowel sounds are normal. There is no distension.     Palpations: Abdomen is soft.     Tenderness: There is abdominal tenderness.     Comments: There is mild tenderness across the lower abdomen.  There is no rebound or guarding.  Genitourinary:    General: Normal vulva.     Vagina: No vaginal discharge.     Comments: Pelvic examination reveals no significant discharge.  There are no adnexal masses and no cervical motion tenderness. Musculoskeletal:        General: Normal range of motion.     Cervical back: Normal range of motion and neck supple.  Skin:    General: Skin is warm and dry.  Neurological:     General: No focal deficit present.     Mental Status: She is alert and oriented to person, place, and time.    ED Results / Procedures / Treatments   Labs (all labs ordered are listed, but only abnormal results are displayed) Labs Reviewed  WET PREP, GENITAL  COMPREHENSIVE METABOLIC PANEL  CBC WITH DIFFERENTIAL/PLATELET  LIPASE, BLOOD  URINALYSIS, ROUTINE W REFLEX MICROSCOPIC  PREGNANCY, URINE  GC/CHLAMYDIA PROBE AMP ([place]) NOT AT [facility]    EKG None  Radiology No results found.  Procedures Procedures   Medications Ordered in ED Medications  sodium chloride 0.9 % bolus 1,000 mL (has no administration in time range)    ED Course  I have reviewed the triage vital signs and the nursing notes.  Pertinent labs & imaging results that were available during my care of the patient were reviewed by me and considered in my medical decision making (see chart for details).    MDM Rules/Calculators/A&P  Patient is a 20 year old female presenting with multiple complaints as described in the HPI, but most notably lower abdominal pain.  Pelvic examination is unremarkable and wet prep is also unremarkable.  GC and Chlamydia cultures  pending.  Laboratory studies are reassuring and CT scan of the abdomen shows no acute intra-abdominal pathology.  The only abnormal finding is nitrate positive urine for which patient will be treated with Keflex.  She will be notified if her GC and Chlamydia cultures require further attention.    Final Clinical Impression(s) / ED  Diagnoses Final diagnoses:  None    Rx / DC Orders ED Discharge Orders     None        Geoffery Lyons, MD 11/27/20 [phone]

## 2020-12-01 LAB — GC/CHLAMYDIA PROBE AMP (~~LOC~~) NOT AT ARMC
Chlamydia: NEGATIVE
Comment: NEGATIVE
Comment: NORMAL
Neisseria Gonorrhea: NEGATIVE

## 2020-12-23 DIAGNOSIS — Z01419 Encounter for gynecological examination (general) (routine) without abnormal findings: Secondary | ICD-10-CM | POA: Diagnosis not present

## 2020-12-23 DIAGNOSIS — Z0000 Encounter for general adult medical examination without abnormal findings: Secondary | ICD-10-CM | POA: Diagnosis not present

## 2020-12-23 DIAGNOSIS — Z87410 Personal history of cervical dysplasia: Secondary | ICD-10-CM | POA: Diagnosis not present

## 2021-02-14 ENCOUNTER — Encounter (HOSPITAL_COMMUNITY): Payer: Self-pay | Admitting: Emergency Medicine

## 2021-02-14 ENCOUNTER — Other Ambulatory Visit: Payer: Self-pay

## 2021-02-14 ENCOUNTER — Emergency Department (HOSPITAL_COMMUNITY)
Admission: EM | Admit: 2021-02-14 | Discharge: 2021-02-14 | Disposition: A | Discharge status: Home / Self Care | Payer: Medicaid Other | Attending: Emergency Medicine | Admitting: Emergency Medicine

## 2021-02-14 DIAGNOSIS — J069 Acute upper respiratory infection, unspecified: Secondary | ICD-10-CM | POA: Diagnosis not present

## 2021-02-14 DIAGNOSIS — Z87891 Personal history of nicotine dependence: Secondary | ICD-10-CM | POA: Diagnosis not present

## 2021-02-14 DIAGNOSIS — R059 Cough, unspecified: Secondary | ICD-10-CM | POA: Diagnosis present

## 2021-02-14 NOTE — ED Triage Notes (Signed)
Pt here for work-note to return to work. Pt states she has been out for cold-like symptoms a couple of days.

## 2021-02-14 NOTE — ED Provider Notes (Signed)
[facility] EMERGENCY DEPARTMENT Provider Note   CSN: [id number] Arrival date & time: 02/14/21  2326     History Chief Complaint  Patient presents with   Cold-like symptoms    Diana Pennington is a 20 y.o. female.  Patient presents to the emergency department for evaluation of cold symptoms.  Patient has had runny nose and dry, nonproductive cough for a couple of days.  She missed work and her employer is requiring a work note to return.  She has not had any fever.       Past Medical History:  Diagnosis Date   ADHD    ADHD (attention deficit hyperactivity disorder)    Attention deficit hyperactivity disorder (ADHD) 07/21/2015   Bipolar 1 disorder (HCC)    Bipolar 1 disorder (HCC)    Chlamydia    Gonorrhea    Insomnia 07/21/2015    Patient Active Problem List   Diagnosis Date Noted   PID (acute pelvic inflammatory disease) 04/11/2017   Indication for care in labor or delivery 04/23/2016   Bipolar affect, depressed (HCC) 07/21/2015   Attention deficit hyperactivity disorder (ADHD) 07/21/2015   Insomnia 07/21/2015    Past Surgical History:  Procedure Laterality Date   CHOLECYSTECTOMY       OB History     Gravida  1   Para  1   Term  1   Preterm      AB      Living  1      SAB      IAB      Ectopic      Multiple  0   Live Births  1           Family History  Problem Relation Age of Onset   Diabetes Mother    Hypertension Mother     Social History   Tobacco Use   Smoking status: Former    Packs/day: 0.50    Years: 1.00    Pack years: 0.50    Types: Cigarettes   Smokeless tobacco: Never  Vaping Use   Vaping Use: Former   Substances: Nicotine, Flavoring  Substance Use Topics   Alcohol use: Not Currently   Drug use: Yes    Types: Marijuana    Home Medications Prior to Admission medications   Medication Sig Start Date End Date Taking? Authorizing Provider  cephALEXin (KEFLEX) 500 MG capsule Take 1 capsule (500 mg total) by  mouth 3 (three) times daily. 11/27/20   Geoffery Lyons, MD  doxycycline (VIBRAMYCIN) 100 MG capsule Take 1 capsule (100 mg total) by mouth 2 (two) times daily. One po bid x 7 days 10/27/20   Bing Neighbors, FNP  ibuprofen (ADVIL) 800 MG tablet Take 1 tablet (800 mg total) by mouth 3 (three) times daily. 10/27/20   Bing Neighbors, FNP  levonorgestrel (MIRENA) 20 MCG/24HR IUD 1 each by Intrauterine route once.    [provider]  metroNIDAZOLE (FLAGYL) 500 MG tablet Take 1 tablet (500 mg total) by mouth 2 (two) times daily. 10/29/20   Lamptey, Britta Mccreedy, MD  miconazole (MICOTIN) 2 % cream Apply 1 application topically 2 (two) times daily. 06/05/20   Dione Booze, MD  ondansetron (ZOFRAN) 4 MG tablet Take 1 tablet (4 mg total) by mouth every 6 (six) hours. 10/27/20   Bing Neighbors, FNP  benztropine (COGENTIN) 0.5 MG tablet Take 1 tablet (0.5 mg total) by mouth 2 (two) times daily. Patient not taking: No sig reported 07/29/15 03/16/20  Amada Kingfisher, Pieter Partridge, MD  pantoprazole (PROTONIX) 40 MG tablet Take 1 tablet (40 mg total) by mouth daily. Patient not taking: Reported on 03/16/2020 09/02/19 03/16/20  Dione Booze, MD    Allergies    Ativan [lorazepam]  Review of Systems   Review of Systems  HENT:  Positive for congestion.   Respiratory:  Positive for cough.   All other systems reviewed and are negative.  Physical Exam Updated Vital Signs There were no vitals taken for this visit.  Physical Exam Vitals and nursing note reviewed.  Constitutional:      General: She is not in acute distress.    Appearance: Normal appearance. She is well-developed.  HENT:     Head: Normocephalic and atraumatic.     Right Ear: Hearing normal.     Left Ear: Hearing normal.     Nose: Nose normal.  Eyes:     Conjunctiva/sclera: Conjunctivae normal.     Pupils: Pupils are equal, round, and reactive to light.  Cardiovascular:     Rate and Rhythm: Regular rhythm.     Heart sounds: S1 normal and  S2 normal. No murmur heard.   No friction rub. No gallop.  Pulmonary:     Effort: Pulmonary effort is normal. No respiratory distress.     Breath sounds: Normal breath sounds.  Chest:     Chest wall: No tenderness.  Abdominal:     General: Bowel sounds are normal.     Palpations: Abdomen is soft.     Tenderness: There is no abdominal tenderness. There is no guarding or rebound. Negative signs include Murphy's sign and McBurney's sign.     Hernia: No hernia is present.  Musculoskeletal:        General: Normal range of motion.     Cervical back: Normal range of motion and neck supple.  Skin:    General: Skin is warm and dry.     Findings: No rash.  Neurological:     Mental Status: She is alert and oriented to person, place, and time.     GCS: GCS eye subscore is 4. GCS verbal subscore is 5. GCS motor subscore is 6.     Cranial Nerves: No cranial nerve deficit.     Sensory: No sensory deficit.     Coordination: Coordination normal.  Psychiatric:        Speech: Speech normal.        Behavior: Behavior normal.        Thought Content: Thought content normal.    ED Results / Procedures / Treatments   Labs (all labs ordered are listed, but only abnormal results are displayed) Labs Reviewed - No data to display  EKG None  Radiology No results found.  Procedures Procedures   Medications Ordered in ED Medications - No data to display  ED Course  I have reviewed the triage vital signs and the nursing notes.  Pertinent labs & imaging results that were available during my care of the patient were reviewed by me and considered in my medical decision making (see chart for details).    MDM Rules/Calculators/A&P                           Patient appears well.  She is afebrile.  Examination is unremarkable other than some mild nasal congestion.  No specific treatment necessary for viral URI.  Final Clinical Impression(s) / ED Diagnoses Final diagnoses:  Upper respiratory  tract infection,  unspecified type    Rx / DC Orders ED Discharge Orders     None        Aadhya Bustamante, Canary Brim, MD 02/14/21 2351

## 2021-08-18 DIAGNOSIS — L739 Follicular disorder, unspecified: Secondary | ICD-10-CM | POA: Diagnosis not present

## 2021-08-18 DIAGNOSIS — Z113 Encounter for screening for infections with a predominantly sexual mode of transmission: Secondary | ICD-10-CM | POA: Diagnosis not present

## 2021-08-19 DIAGNOSIS — A6000 Herpesviral infection of urogenital system, unspecified: Secondary | ICD-10-CM | POA: Diagnosis not present

## 2021-12-16 DIAGNOSIS — Z6824 Body mass index (BMI) 24.0-24.9, adult: Secondary | ICD-10-CM | POA: Diagnosis not present

## 2021-12-16 DIAGNOSIS — F419 Anxiety disorder, unspecified: Secondary | ICD-10-CM | POA: Diagnosis not present

## 2021-12-23 ENCOUNTER — Encounter: Payer: Self-pay | Admitting: Emergency Medicine

## 2021-12-23 ENCOUNTER — Ambulatory Visit
Admission: EM | Admit: 2021-12-23 | Discharge: 2021-12-23 | Disposition: A | Discharge status: Home / Self Care | Payer: Medicaid Other | Attending: Family Medicine | Admitting: Family Medicine

## 2021-12-23 DIAGNOSIS — N760 Acute vaginitis: Secondary | ICD-10-CM | POA: Diagnosis not present

## 2021-12-23 LAB — POCT URINALYSIS DIP (MANUAL ENTRY)
Bilirubin, UA: NEGATIVE
Glucose, UA: NEGATIVE mg/dL
Ketones, POC UA: NEGATIVE mg/dL
Nitrite, UA: NEGATIVE
Protein Ur, POC: NEGATIVE mg/dL
Spec Grav, UA: 1.02 (ref 1.010–1.025)
Urobilinogen, UA: 0.2 E.U./dL
pH, UA: 7 (ref 5.0–8.0)

## 2021-12-23 LAB — POCT URINE PREGNANCY: Preg Test, Ur: NEGATIVE

## 2021-12-23 NOTE — Discharge Instructions (Addendum)
You may take female health probiotics, boric acid vaginal suppositories to help balance vaginal pH and prevent bacterial infections.  These are both found anywhere you like to shop, drugstores, [place], [employer], [email or web address]  We will be in touch with your remaining results when they come in if anything is abnormal and send in treatment based on these.

## 2021-12-23 NOTE — ED Triage Notes (Signed)
Patient c/o possible UTI, lower abdominal pain, clear vaginal discharge w/some odor.  No urinary sx's.  No concern for STI.

## 2021-12-23 NOTE — ED Provider Notes (Signed)
[facility]    CSN: [id number] Arrival date & time: 12/23/21  1213      History   Chief Complaint Chief Complaint  Patient presents with   Vaginal Discharge    HPI Diana Pennington is a 21 y.o. female.   Presenting today with several day history of suprapubic pains intermittently, urinary frequency, vaginal discharge with an odor.  Denies fever, chills, abdominal pain, nausea, vomiting, diarrhea, vaginal rashes or lesions, known exposures to any STIs.  Denies concerns for pregnancy at this time.  Not trying anything over-the-counter for symptoms.    Past Medical History:  Diagnosis Date   ADHD    ADHD (attention deficit hyperactivity disorder)    Attention deficit hyperactivity disorder (ADHD) 07/21/2015   Bipolar 1 disorder (HCC)    Bipolar 1 disorder (HCC)    Chlamydia    Gonorrhea    Insomnia 07/21/2015    Patient Active Problem List   Diagnosis Date Noted   PID (acute pelvic inflammatory disease) 04/11/2017   Indication for care in labor or delivery 04/23/2016   Bipolar affect, depressed (HCC) 07/21/2015   Attention deficit hyperactivity disorder (ADHD) 07/21/2015   Insomnia 07/21/2015    Past Surgical History:  Procedure Laterality Date   CHOLECYSTECTOMY      OB History     Gravida  1   Para  1   Term  1   Preterm      AB      Living  1      SAB      IAB      Ectopic      Multiple  0   Live Births  1            Home Medications    Prior to Admission medications   Medication Sig Start Date End Date Taking? Authorizing Provider  cephALEXin (KEFLEX) 500 MG capsule Take 1 capsule (500 mg total) by mouth 3 (three) times daily. 11/27/20   Geoffery Lyons, MD  doxycycline (VIBRAMYCIN) 100 MG capsule Take 1 capsule (100 mg total) by mouth 2 (two) times daily. One po bid x 7 days 10/27/20   Bing Neighbors, FNP  ibuprofen (ADVIL) 800 MG tablet Take 1 tablet (800 mg total) by mouth 3 (three) times daily. 10/27/20   Bing Neighbors,  FNP  levonorgestrel (MIRENA) 20 MCG/24HR IUD 1 each by Intrauterine route once.    [provider]  metroNIDAZOLE (FLAGYL) 500 MG tablet Take 1 tablet (500 mg total) by mouth 2 (two) times daily. 10/29/20   Lamptey, Britta Mccreedy, MD  miconazole (MICOTIN) 2 % cream Apply 1 application topically 2 (two) times daily. 06/05/20   Dione Booze, MD  ondansetron (ZOFRAN) 4 MG tablet Take 1 tablet (4 mg total) by mouth every 6 (six) hours. 10/27/20   Bing Neighbors, FNP  benztropine (COGENTIN) 0.5 MG tablet Take 1 tablet (0.5 mg total) by mouth 2 (two) times daily. Patient not taking: No sig reported 07/29/15 03/16/20  Saez-Benito, Phillips Climes, MD  pantoprazole (PROTONIX) 40 MG tablet Take 1 tablet (40 mg total) by mouth daily. Patient not taking: Reported on 03/16/2020 09/02/19 03/16/20  Dione Booze, MD    Family History Family History  Problem Relation Age of Onset   Diabetes Mother    Hypertension Mother     Social History Social History   Tobacco Use   Smoking status: Every Day    Types: Cigars   Smokeless tobacco: Never  Vaping Use  Vaping Use: Former   Substances: Nicotine, Flavoring  Substance Use Topics   Alcohol use: Not Currently   Drug use: Yes    Types: Marijuana     Allergies   Ativan [lorazepam]   Review of Systems Review of Systems Per HPI  Physical Exam Triage Vital Signs ED Triage Vitals [12/23/21 1301]  Enc Vitals Group     BP 105/71     Pulse Rate 64     Resp 18     Temp 99.1 F (37.3 C)     Temp Source Oral     SpO2 98 %     Weight 135 lb (61.2 kg)     Height 5\' 1"  (1.549 m)     Head Circumference      Peak Flow      Pain Score 5     Pain Loc      Pain Edu?      Excl. in [place]?    No data found.  Updated Vital Signs BP 105/71 (BP Location: Right Arm)   Pulse 64   Temp 99.1 F (37.3 C) (Oral)   Resp 18   Ht 5\' 1"  (1.549 m)   Wt 135 lb (61.2 kg)   SpO2 98%   Breastfeeding No   BMI 25.51 kg/m   Visual Acuity Right Eye  Distance:   Left Eye Distance:   Bilateral Distance:    Right Eye Near:   Left Eye Near:    Bilateral Near:     Physical Exam Vitals and nursing note reviewed.  Constitutional:      Appearance: Normal appearance. She is not ill-appearing.  HENT:     Head: Atraumatic.     Mouth/Throat:     Mouth: Mucous membranes are moist.  Eyes:     Extraocular Movements: Extraocular movements intact.     Conjunctiva/sclera: Conjunctivae normal.  Cardiovascular:     Rate and Rhythm: Normal rate and regular rhythm.     Heart sounds: Normal heart sounds.  Pulmonary:     Effort: Pulmonary effort is normal.     Breath sounds: Normal breath sounds.  Abdominal:     General: Bowel sounds are normal. There is no distension.     Palpations: Abdomen is soft.     Tenderness: There is no abdominal tenderness. There is no guarding.  Genitourinary:    Comments: GU exam deferred, self swab performed Musculoskeletal:        General: Normal range of motion.     Cervical back: Normal range of motion and neck supple.  Skin:    General: Skin is warm and dry.  Neurological:     Mental Status: She is alert and oriented to person, place, and time.  Psychiatric:        Mood and Affect: Mood normal.        Thought Content: Thought content normal.        Judgment: Judgment normal.      UC Treatments / Results  Labs (all labs ordered are listed, but only abnormal results are displayed) Labs Reviewed  POCT URINALYSIS DIP (MANUAL ENTRY) - Abnormal; Notable for the following components:      Result Value   Blood, UA trace-lysed (*)    Leukocytes, UA Small (1+) (*)    All other components within normal limits  URINE CULTURE  POCT URINE PREGNANCY  CERVICOVAGINAL ANCILLARY ONLY    EKG   Radiology No results found.  Procedures Procedures (including critical care time)  Medications Ordered in UC Medications - No data to display  Initial Impression / Assessment and Plan / UC Course  I have  reviewed the triage vital signs and the nursing notes.  Pertinent labs & imaging results that were available during my care of the patient were reviewed by me and considered in my medical decision making (see chart for details).     Leukocytes present in urine, however not a convincing result for urinary tract infection.  Urine culture and vaginal swab both pending, urine pregnancy was negative.  Discussed supportive over-the-counter medications and home care while awaiting results and will treat based on these results.  Suspect bacterial vaginosis to be causing her symptoms.  Final Clinical Impressions(s) / UC Diagnoses   Final diagnoses:  Acute vaginitis     Discharge Instructions      You may take female health probiotics, boric acid vaginal suppositories to help balance vaginal pH and prevent bacterial infections.  These are both found anywhere you like to shop, drugstores, [place], [employer], [email or web address].  We will be in touch with your remaining results when they come in if anything is abnormal and send in treatment based on these.    ED Prescriptions   None    PDMP not reviewed this encounter.   Particia Nearing, [place] 12/23/21 1328

## 2021-12-24 ENCOUNTER — Telehealth (HOSPITAL_COMMUNITY): Payer: Self-pay | Admitting: Emergency Medicine

## 2021-12-24 LAB — CERVICOVAGINAL ANCILLARY ONLY
Bacterial Vaginitis (gardnerella): POSITIVE — AB
Candida Glabrata: NEGATIVE
Candida Vaginitis: NEGATIVE
Chlamydia: POSITIVE — AB
Comment: NEGATIVE
Comment: NEGATIVE
Comment: NEGATIVE
Comment: NEGATIVE
Comment: NEGATIVE
Comment: NORMAL
Neisseria Gonorrhea: NEGATIVE
Trichomonas: NEGATIVE

## 2021-12-24 MED ORDER — METRONIDAZOLE 0.75 % VA GEL: 1.0000 | Freq: Every day | VAGINAL | 0 refills | Status: AC

## 2021-12-24 MED ORDER — DOXYCYCLINE HYCLATE 100 MG PO CAPS: 100.0000 mg | ORAL_CAPSULE | Freq: Two times a day (BID) | ORAL | 0 refills | Status: AC

## 2021-12-25 LAB — URINE CULTURE: Culture: 40000 — AB

## 2021-12-27 ENCOUNTER — Telehealth (HOSPITAL_COMMUNITY): Payer: Self-pay | Admitting: Emergency Medicine

## 2021-12-27 MED ORDER — NITROFURANTOIN MONOHYD MACRO 100 MG PO CAPS: 100.0000 mg | ORAL_CAPSULE | Freq: Two times a day (BID) | ORAL | 0 refills | Status: AC

## 2022-01-20 DIAGNOSIS — N898 Other specified noninflammatory disorders of vagina: Secondary | ICD-10-CM | POA: Diagnosis not present

## 2022-01-20 DIAGNOSIS — R109 Unspecified abdominal pain: Secondary | ICD-10-CM | POA: Diagnosis not present

## 2022-01-20 DIAGNOSIS — N739 Female pelvic inflammatory disease, unspecified: Secondary | ICD-10-CM | POA: Diagnosis not present

## 2022-02-27 DIAGNOSIS — H5213 Myopia, bilateral: Secondary | ICD-10-CM | POA: Diagnosis not present

## 2022-03-22 DIAGNOSIS — J209 Acute bronchitis, unspecified: Secondary | ICD-10-CM | POA: Diagnosis not present

## 2022-03-22 DIAGNOSIS — J069 Acute upper respiratory infection, unspecified: Secondary | ICD-10-CM | POA: Diagnosis not present

## 2022-03-22 DIAGNOSIS — J020 Streptococcal pharyngitis: Secondary | ICD-10-CM | POA: Diagnosis not present

## 2022-05-02 ENCOUNTER — Ambulatory Visit
Admission: EM | Admit: 2022-05-02 | Discharge: 2022-05-02 | Discharge status: Absent without leave | Payer: Medicaid Other

## 2023-05-09 DIAGNOSIS — Z0000 Encounter for general adult medical examination without abnormal findings: Secondary | ICD-10-CM | POA: Diagnosis not present

## 2023-05-09 DIAGNOSIS — Z5941 Food insecurity: Secondary | ICD-10-CM | POA: Diagnosis not present

## 2023-05-09 DIAGNOSIS — Z5986 Financial insecurity: Secondary | ICD-10-CM | POA: Diagnosis not present

## 2023-05-09 DIAGNOSIS — F419 Anxiety disorder, unspecified: Secondary | ICD-10-CM | POA: Diagnosis not present

## 2023-05-09 DIAGNOSIS — Z1331 Encounter for screening for depression: Secondary | ICD-10-CM | POA: Diagnosis not present

## 2023-05-09 DIAGNOSIS — Z6829 Body mass index (BMI) 29.0-29.9, adult: Secondary | ICD-10-CM | POA: Diagnosis not present

## 2023-05-09 DIAGNOSIS — E663 Overweight: Secondary | ICD-10-CM | POA: Diagnosis not present

## 2023-06-02 DIAGNOSIS — Z6831 Body mass index (BMI) 31.0-31.9, adult: Secondary | ICD-10-CM | POA: Diagnosis not present

## 2023-06-02 DIAGNOSIS — F3131 Bipolar disorder, current episode depressed, mild: Secondary | ICD-10-CM | POA: Diagnosis not present

## 2023-06-02 DIAGNOSIS — F419 Anxiety disorder, unspecified: Secondary | ICD-10-CM | POA: Diagnosis not present

## 2023-06-02 DIAGNOSIS — E6609 Other obesity due to excess calories: Secondary | ICD-10-CM | POA: Diagnosis not present

## 2024-05-22 ENCOUNTER — Ambulatory Visit: Admitting: Obstetrics & Gynecology

## 2024-05-27 ENCOUNTER — Encounter: Admitting: Obstetrics & Gynecology
